# Patient Record
Sex: Male | Born: 1950 | Race: White | Hispanic: No | Marital: Married | Smoking: Former smoker
Health system: Southern US, Community
[De-identification: ages and names within clinical notes are randomized; demographics above are authoritative.]

## PROBLEM LIST (undated history)

## (undated) DIAGNOSIS — R6882 Decreased libido: Secondary | ICD-10-CM

## (undated) DIAGNOSIS — E559 Vitamin D deficiency, unspecified: Secondary | ICD-10-CM

## (undated) DIAGNOSIS — I10 Essential (primary) hypertension: Secondary | ICD-10-CM

## (undated) HISTORY — DX: Vitamin D deficiency, unspecified: E55.9

## (undated) HISTORY — DX: Essential (primary) hypertension: I10

## (undated) HISTORY — PX: CATARACT EXTRACTION, BILATERAL: SHX1313

---
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
## 2003-05-24 ENCOUNTER — Ambulatory Visit (HOSPITAL_COMMUNITY): Admission: RE | Admit: 2003-05-24 | Discharge: 2003-05-24 | Payer: Self-pay | Admitting: Internal Medicine

## 2013-05-09 ENCOUNTER — Encounter: Payer: Self-pay | Admitting: Family Medicine

## 2013-05-09 ENCOUNTER — Ambulatory Visit (INDEPENDENT_AMBULATORY_CARE_PROVIDER_SITE_OTHER): Payer: 59 | Admitting: Family Medicine

## 2013-05-09 VITALS — BP 122/82 | Ht 69.0 in | Wt 187.8 lb

## 2013-05-09 DIAGNOSIS — E782 Mixed hyperlipidemia: Secondary | ICD-10-CM

## 2013-05-09 DIAGNOSIS — G629 Polyneuropathy, unspecified: Secondary | ICD-10-CM | POA: Insufficient documentation

## 2013-05-09 DIAGNOSIS — Z125 Encounter for screening for malignant neoplasm of prostate: Secondary | ICD-10-CM

## 2013-05-09 DIAGNOSIS — E785 Hyperlipidemia, unspecified: Secondary | ICD-10-CM

## 2013-05-09 DIAGNOSIS — Z79899 Other long term (current) drug therapy: Secondary | ICD-10-CM

## 2013-05-09 DIAGNOSIS — Z0000 Encounter for general adult medical examination without abnormal findings: Secondary | ICD-10-CM

## 2013-05-09 DIAGNOSIS — M47812 Spondylosis without myelopathy or radiculopathy, cervical region: Secondary | ICD-10-CM

## 2013-05-09 DIAGNOSIS — G609 Hereditary and idiopathic neuropathy, unspecified: Secondary | ICD-10-CM

## 2013-05-09 LAB — PSA: PSA: 0.86 ng/mL (ref <=4.00)

## 2013-05-09 LAB — HEPATIC FUNCTION PANEL
ALT: 23 U/L (ref 0–53)
Albumin: 4.4 g/dL (ref 3.5–5.2)
Bilirubin, Direct: 0.1 mg/dL (ref 0.0–0.3)
Indirect Bilirubin: 0.4 mg/dL (ref 0.0–0.9)
Total Protein: 7.3 g/dL (ref 6.0–8.3)

## 2013-05-09 LAB — BASIC METABOLIC PANEL
Chloride: 103 mEq/L (ref 96–112)
Glucose, Bld: 94 mg/dL (ref 70–99)
Potassium: 4.5 mEq/L (ref 3.5–5.3)
Sodium: 139 mEq/L (ref 135–145)

## 2013-05-09 LAB — LIPID PANEL
Cholesterol: 211 mg/dL — ABNORMAL HIGH (ref 0–200)
HDL: 41 mg/dL (ref >39)
LDL Cholesterol: 129 mg/dL — ABNORMAL HIGH (ref 0–99)
Total CHOL/HDL Ratio: 5.1 Ratio
VLDL: 41 mg/dL — ABNORMAL HIGH (ref 0–40)

## 2013-05-09 NOTE — Progress Notes (Signed)
  Subjective:    Patient ID: Benjamin Todd, male    DOB: 1950/12/20, 62 y.o.   MRN: [id number]  HPI  Patient arrives for annual physical.  At times cracking sensaation in the neck--uncomfortable. Positive pain at times.  Gets up and down at work a lot, every eve walks.  Eats whatever he wants, has a variety of foods, plenty veggies.  Notes feet improvement when taking B12 annd folic acid. Smokes cigars still   Review of Systems  Constitutional: Negative for fever, activity change and appetite change.  HENT: Negative for congestion, rhinorrhea and neck pain.   Eyes: Negative for discharge.  Respiratory: Negative for cough and wheezing.   Cardiovascular: Negative for chest pain.  Gastrointestinal: Negative for vomiting, abdominal pain and blood in stool.  Genitourinary: Negative for frequency and difficulty urinating.  Skin: Negative for rash.  Allergic/Immunologic: Negative for environmental allergies and food allergies.  Neurological: Negative for weakness and headaches.  Psychiatric/Behavioral: Negative for agitation.       Objective:   Physical Exam  Vitals reviewed. Constitutional: He appears well-developed and well-nourished.  HENT:  Head: Normocephalic and atraumatic.  Right Ear: External ear normal.  Left Ear: External ear normal.  Nose: Nose normal.  Mouth/Throat: Oropharynx is clear and moist.  Eyes: EOM are normal. Pupils are equal, round, and reactive to light.  Neck: Normal range of motion. Neck supple. No thyromegaly present.  Cardiovascular: Normal rate, regular rhythm and normal heart sounds.   No murmur heard. Pulmonary/Chest: Effort normal and breath sounds normal. No respiratory distress. He has no wheezes.  Abdominal: Soft. Bowel sounds are normal. He exhibits no distension and no mass. There is no tenderness.  Genitourinary: Penis normal.  Musculoskeletal: Normal range of motion. He exhibits no edema.  Lymphadenopathy:    He has no cervical  adenopathy.  Neurological: He is alert. He exhibits normal muscle tone.  Skin sensation slightly diminished and distal plantar surface bilateral feet  Skin: Skin is warm and dry. No erythema.  Psychiatric: He has a normal mood and affect. His behavior is normal. Judgment normal.   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 #4 distal diminished sensation stable per patient actually somewhat better with over-the-counter vitamin B12 and folic acid supplement. Plan patient to call and set up a scope with his gastroenterologist. Declines vaccines. Appropriate blood work. Diet exercise discussed in encourage. Encouraged to quit smoking. WSL

## 2013-05-09 NOTE — Patient Instructions (Signed)
Take 1000 micrograms b12 daily and 1 mg of folic acid daily  Be sure to get your colonoscopy

## 2015-08-24 ENCOUNTER — Ambulatory Visit (INDEPENDENT_AMBULATORY_CARE_PROVIDER_SITE_OTHER): Payer: Managed Care, Other (non HMO) | Admitting: Family Medicine

## 2015-08-24 ENCOUNTER — Encounter: Payer: Self-pay | Admitting: Family Medicine

## 2015-08-24 VITALS — BP 170/84 | HR 113 | Temp 98.1°F | Ht 69.0 in | Wt 191.0 lb

## 2015-08-24 DIAGNOSIS — J683 Other acute and subacute respiratory conditions due to chemicals, gases, fumes and vapors: Secondary | ICD-10-CM

## 2015-08-24 DIAGNOSIS — J4520 Mild intermittent asthma, uncomplicated: Secondary | ICD-10-CM

## 2015-08-24 DIAGNOSIS — J329 Chronic sinusitis, unspecified: Secondary | ICD-10-CM | POA: Diagnosis not present

## 2015-08-24 MED ORDER — LEVOFLOXACIN 500 MG PO TABS: 500.0000 mg | ORAL_TABLET | Freq: Every day | ORAL | Status: AC

## 2015-08-24 MED ORDER — ALBUTEROL SULFATE HFA 108 (90 BASE) MCG/ACT IN AERS: 2.0000 | INHALATION_SPRAY | Freq: Four times a day (QID) | RESPIRATORY_TRACT | Status: DC | PRN

## 2015-08-24 NOTE — Progress Notes (Signed)
   Subjective:    Patient ID: Benjamin Todd, male    DOB: [birth year], 65 y.o.   MRN: [id number]  Cough This is a new problem. The current episode started in the past 7 days. The problem has been gradually worsening. The problem occurs every few minutes. The cough is productive of sputum (Dark Green Sputum). Associated symptoms include shortness of breath and wheezing. Nothing aggravates the symptoms. He has tried OTC cough suppressant (Robitussin) for the symptoms.   Started a s scratchy throat  Coughing up gunk  troubl catching breath  Sig wheezy, Has not had to use an inhaler in the past No major fever  Patient states no other concerns this visit.  Review of Systems  Respiratory: Positive for cough, shortness of breath and wheezing.    Ongoing smoking habit   no vomiting or diarrhea Objective:   Physical Exam  Alert mild malaise. Blood pressure repeat significantly improved H&T moderate his congestion frontal tenderness pharynx erythematous lungs bilateral wheezing no tachypnea positive bronchial cough lungs clear      Assessment & Plan:  Impression rhinosinusitis/bronchitis with exacerbation of reactive airways plan albuterol use discussed. Daily Levaquin. Encouraged to stop smoking WSL wellness exam encouraged

## 2016-03-11 ENCOUNTER — Telehealth: Payer: Self-pay | Admitting: Family Medicine

## 2016-03-11 DIAGNOSIS — E785 Hyperlipidemia, unspecified: Secondary | ICD-10-CM

## 2016-03-11 DIAGNOSIS — Z139 Encounter for screening, unspecified: Secondary | ICD-10-CM

## 2016-03-11 NOTE — Telephone Encounter (Signed)
Lip liv m7 psa 

## 2016-03-11 NOTE — Telephone Encounter (Signed)
Spoke with patient and informed him per Dr.Steve Whitman ordered, needs to be fasting prior to having labs drawn. Patient verbalized understanding.

## 2016-03-11 NOTE — Telephone Encounter (Signed)
Patient is requesting lab paper before physical appointment on Aug.23.

## 2016-03-18 LAB — HEPATIC FUNCTION PANEL
ALK PHOS: 61 IU/L (ref 39–117)
ALT: 27 IU/L (ref 0–44)
AST: 19 IU/L (ref 0–40)
Albumin: 4.5 g/dL (ref 3.6–4.8)
BILIRUBIN TOTAL: 0.4 mg/dL (ref 0.0–1.2)
Bilirubin, Direct: 0.12 mg/dL (ref 0.00–0.40)
Total Protein: 7 g/dL (ref 6.0–8.5)

## 2016-03-18 LAB — PSA: Prostate Specific Ag, Serum: 1.1 ng/mL (ref 0.0–4.0)

## 2016-03-18 LAB — BASIC METABOLIC PANEL
BUN / CREAT RATIO: 12 (ref 10–24)
BUN: 14 mg/dL (ref 8–27)
CO2: 23 mmol/L (ref 18–29)
Calcium: 9.4 mg/dL (ref 8.6–10.2)
Chloride: 100 mmol/L (ref 96–106)
Creatinine, Ser: 1.16 mg/dL (ref 0.76–1.27)
GFR calc Af Amer: 75 mL/min/{1.73_m2} (ref >59)
GFR calc non Af Amer: 65 mL/min/{1.73_m2} (ref >59)
Glucose: 109 mg/dL — ABNORMAL HIGH (ref 65–99)
POTASSIUM: 4.8 mmol/L (ref 3.5–5.2)
SODIUM: 139 mmol/L (ref 134–144)

## 2016-03-18 LAB — LIPID PANEL
CHOLESTEROL TOTAL: 220 mg/dL — AB (ref 100–199)
Chol/HDL Ratio: 5.4 ratio units — ABNORMAL HIGH (ref 0.0–5.0)
HDL: 41 mg/dL (ref >39)
LDL CALC: 132 mg/dL — AB (ref 0–99)
TRIGLYCERIDES: 233 mg/dL — AB (ref 0–149)
VLDL Cholesterol Cal: 47 mg/dL — ABNORMAL HIGH (ref 5–40)

## 2016-03-27 ENCOUNTER — Ambulatory Visit (HOSPITAL_COMMUNITY)
Admission: RE | Admit: 2016-03-27 | Discharge: 2016-03-27 | Disposition: A | Discharge status: Home / Self Care | Payer: BLUE CROSS/BLUE SHIELD | Source: Ambulatory Visit | Attending: Family Medicine | Admitting: Family Medicine

## 2016-03-27 ENCOUNTER — Encounter: Payer: Self-pay | Admitting: Family Medicine

## 2016-03-27 ENCOUNTER — Ambulatory Visit (INDEPENDENT_AMBULATORY_CARE_PROVIDER_SITE_OTHER): Payer: BLUE CROSS/BLUE SHIELD | Admitting: Family Medicine

## 2016-03-27 VITALS — BP 148/80 | Temp 98.1°F | Ht 69.0 in | Wt 192.2 lb

## 2016-03-27 DIAGNOSIS — R918 Other nonspecific abnormal finding of lung field: Secondary | ICD-10-CM | POA: Diagnosis not present

## 2016-03-27 DIAGNOSIS — R0601 Orthopnea: Secondary | ICD-10-CM | POA: Diagnosis not present

## 2016-03-27 DIAGNOSIS — J019 Acute sinusitis, unspecified: Secondary | ICD-10-CM | POA: Diagnosis not present

## 2016-03-27 DIAGNOSIS — J209 Acute bronchitis, unspecified: Secondary | ICD-10-CM | POA: Insufficient documentation

## 2016-03-27 DIAGNOSIS — B9689 Other specified bacterial agents as the cause of diseases classified elsewhere: Secondary | ICD-10-CM

## 2016-03-27 DIAGNOSIS — I700 Atherosclerosis of aorta: Secondary | ICD-10-CM | POA: Insufficient documentation

## 2016-03-27 DIAGNOSIS — R0600 Dyspnea, unspecified: Secondary | ICD-10-CM

## 2016-03-27 MED ORDER — LEVOFLOXACIN 500 MG PO TABS: 500.0000 mg | ORAL_TABLET | Freq: Every day | ORAL | 0 refills | Status: DC

## 2016-03-27 MED ORDER — ALBUTEROL SULFATE HFA 108 (90 BASE) MCG/ACT IN AERS: 2.0000 | INHALATION_SPRAY | Freq: Four times a day (QID) | RESPIRATORY_TRACT | 2 refills | Status: DC | PRN

## 2016-03-27 NOTE — Progress Notes (Signed)
   Subjective:    Patient ID: Benjamin Todd, male    DOB: [birth year], 65 y.o.   MRN: [id number]  Cough  This is a new problem. The current episode started 1 to 4 weeks ago. Associated symptoms include nasal congestion, rhinorrhea and wheezing. Pertinent negatives include no chest pain, ear pain or fever. Treatments tried: mucinex.   Patient relates coughing congestion occasional wheezing also relates bringing up yellow-green phlegm denies severe headaches denies sinus pressure pain. Does relate head congestion.   Review of Systems  Constitutional: Negative for activity change and fever.  HENT: Positive for congestion and rhinorrhea. Negative for ear pain.   Eyes: Negative for discharge.  Respiratory: Positive for cough and wheezing.   Cardiovascular: Negative for chest pain.       Objective:   Physical Exam  Constitutional: He appears well-developed.  HENT:  Head: Normocephalic.  Mouth/Throat: Oropharynx is clear and moist. No oropharyngeal exudate.  Neck: Normal range of motion.  Cardiovascular: Normal rate, regular rhythm and normal heart sounds.   No murmur heard. Pulmonary/Chest: Effort normal and breath sounds normal. He has no wheezes.  Lymphadenopathy:    He has no cervical adenopathy.  Neurological: He exhibits normal muscle tone.  Skin: Skin is warm and dry.  Nursing note and vitals reviewed.         Assessment & Plan:  Severe bronchitis Some mild nocturnal dyspnea Chest x-ray I doubt congestive heart failure patient denies any chest tightness swelling in the legs or shortness of breath with activity. Await the chest x-ray result Levaquin 10 days Patient encouraged not to smoke Patient may benefit from COPD workup if ongoing symptoms. Patient does have a follow-up visit in the near future warning signs were discussed follow-up sooner problems

## 2016-04-09 ENCOUNTER — Encounter: Payer: Managed Care, Other (non HMO) | Admitting: Family Medicine

## 2016-04-16 ENCOUNTER — Ambulatory Visit (INDEPENDENT_AMBULATORY_CARE_PROVIDER_SITE_OTHER): Payer: BLUE CROSS/BLUE SHIELD | Admitting: Family Medicine

## 2016-04-16 ENCOUNTER — Encounter: Payer: Self-pay | Admitting: Family Medicine

## 2016-04-16 ENCOUNTER — Telehealth: Payer: Self-pay | Admitting: Family Medicine

## 2016-04-16 VITALS — BP 154/90 | Temp 98.4°F | Ht 68.25 in | Wt 190.0 lb

## 2016-04-16 DIAGNOSIS — J441 Chronic obstructive pulmonary disease with (acute) exacerbation: Secondary | ICD-10-CM | POA: Diagnosis not present

## 2016-04-16 DIAGNOSIS — E785 Hyperlipidemia, unspecified: Secondary | ICD-10-CM | POA: Diagnosis not present

## 2016-04-16 DIAGNOSIS — I10 Essential (primary) hypertension: Secondary | ICD-10-CM | POA: Diagnosis not present

## 2016-04-16 DIAGNOSIS — Z0000 Encounter for general adult medical examination without abnormal findings: Secondary | ICD-10-CM | POA: Diagnosis not present

## 2016-04-16 MED ORDER — SILDENAFIL CITRATE 20 MG PO TABS: ORAL_TABLET | ORAL | 0 refills | Status: DC

## 2016-04-16 NOTE — Telephone Encounter (Signed)
Pt called stating that he has decided to start taking a cholesterol medicine. Pt would like that called in.      [facility]

## 2016-04-16 NOTE — Patient Instructions (Signed)
B12 1000 micrograms and folic acid one mg daily  Hypertension Hypertension, commonly called high blood pressure, is when the force of blood pumping through your arteries is too strong. Your arteries are the blood vessels that carry blood from your heart throughout your body. A blood pressure reading consists of a higher number over a lower number, such as 110/72. The higher number (systolic) is the pressure inside your arteries when your heart pumps. The lower number (diastolic) is the pressure inside your arteries when your heart relaxes. Ideally you want your blood pressure below 120/80. Hypertension forces your heart to work harder to pump blood. Your arteries may become narrow or stiff. Having untreated or uncontrolled hypertension can cause heart attack, stroke, kidney disease, and other problems. RISK FACTORS Some risk factors for high blood pressure are controllable. Others are not.  Risk factors you cannot control include:   Race. You may be at higher risk if you are African American.  Age. Risk increases with age.  Gender. Men are at higher risk than women before age 8 years. After age 39, women are at higher risk than men. Risk factors you can control include:  Not getting enough exercise or physical activity.  Being overweight.  Getting too much fat, sugar, calories, or salt in your diet.  Drinking too much alcohol. SIGNS AND SYMPTOMS Hypertension does not usually cause signs or symptoms. Extremely high blood pressure (hypertensive crisis) may cause headache, anxiety, shortness of breath, and nosebleed. DIAGNOSIS To check if you have hypertension, your health care provider will measure your blood pressure while you are seated, with your arm held at the level of your heart. It should be measured at least twice using the same arm. Certain conditions can cause a difference in blood pressure between your right and left arms. A blood pressure reading that is higher than normal on one  occasion does not mean that you need treatment. If it is not clear whether you have high blood pressure, you may be asked to return on a different day to have your blood pressure checked again. Or, you may be asked to monitor your blood pressure at home for 1 or more weeks. TREATMENT Treating high blood pressure includes making lifestyle changes and possibly taking medicine. Living a healthy lifestyle can help lower high blood pressure. You may need to change some of your habits. Lifestyle changes may include:  Following the DASH diet. This diet is high in fruits, vegetables, and whole grains. It is low in salt, red meat, and added sugars.  Keep your sodium intake below 2,300 mg per day.  Getting at least 30-45 minutes of aerobic exercise at least 4 times per week.  Losing weight if necessary.  Not smoking.  Limiting alcoholic beverages.  Learning ways to reduce stress. Your health care provider may prescribe medicine if lifestyle changes are not enough to get your blood pressure under control, and if one of the following is true:  You are 43-109 years of age and your systolic blood pressure is above 140.  You are 49 years of age or older, and your systolic blood pressure is above 150.  Your diastolic blood pressure is above 90.  You have diabetes, and your systolic blood pressure is over [id number] or your diastolic blood pressure is over 90.  You have kidney disease and your blood pressure is above 140/90.  You have heart disease and your blood pressure is above 140/90. Your personal target blood pressure may vary depending on your  medical conditions, your age, and other factors. HOME CARE INSTRUCTIONS  Have your blood pressure rechecked as directed by your health care provider.   Take medicines only as directed by your health care provider. Follow the directions carefully. Blood pressure medicines must be taken as prescribed. The medicine does not work as well when you skip doses.  Skipping doses also puts you at risk for problems.  Do not smoke.   Monitor your blood pressure at home as directed by your health care provider. SEEK MEDICAL CARE IF:   You think you are having a reaction to medicines taken.  You have recurrent headaches or feel dizzy.  You have swelling in your ankles.  You have trouble with your vision. SEEK IMMEDIATE MEDICAL CARE IF:  You develop a severe headache or confusion.  You have unusual weakness, numbness, or feel faint.  You have severe chest or abdominal pain.  You vomit repeatedly.  You have trouble breathing. MAKE SURE YOU:   Understand these instructions.  Will watch your condition.  Will get help right away if you are not doing well or get worse.   This information is not intended to replace advice given to you by your health care provider. Make sure you discuss any questions you have with your health care provider.   Document Released: 08/04/2005 Document Revised: 12/19/2014 Document Reviewed: 05/27/2013 Elsevier Interactive Patient Education 2016 Else[employer] Fat and Cholesterol Restricted Diet High levels of fat and cholesterol in your blood may lead to various health problems, such as diseases of the heart, blood vessels, gallbladder, liver, and pancreas. Fats are concentrated sources of energy that come in various forms. Certain types of fat, including saturated fat, may be harmful in excess. Cholesterol is a substance needed by your body in small amounts. Your body makes all the cholesterol it needs. Excess cholesterol comes from the food you eat. When you have high levels of cholesterol and saturated fat in your blood, health problems can develop because the excess fat and cholesterol will gather along the walls of your blood vessels, causing them to narrow. Choosing the right foods will help you control your intake of fat and cholesterol. This will help keep the levels of these substances in your blood within  normal limits and reduce your risk of disease. WHAT IS MY PLAN? Your health care provider recommends that you:  Get no more than __________ % of the total calories in your daily diet from fat.  Limit your intake of saturated fat to less than ______% of your total calories each day.  Limit the amount of cholesterol in your diet to less than _________mg per day. WHAT TYPES OF FAT SHOULD I CHOOSE?  Choose healthy fats more often. Choose monounsaturated and polyunsaturated fats, such as olive and canola oil, flaxseeds, walnuts, almonds, and seeds.  Eat more omega-3 fats. Good choices include salmon, mackerel, sardines, tuna, flaxseed oil, and ground flaxseeds. Aim to eat fish at least two times a week.  Limit saturated fats. Saturated fats are primarily found in animal products, such as meats, butter, and cream. Plant sources of saturated fats include palm oil, palm kernel oil, and coconut oil.  Avoid foods with partially hydrogenated oils in them. These contain trans fats. Examples of foods that contain trans fats are stick margarine, some tub margarines, cookies, crackers, and other baked goods. WHAT GENERAL GUIDELINES DO I NEED TO FOLLOW? These guidelines for healthy eating will help you control your intake of fat and cholesterol:  Check  food labels carefully to identify foods with trans fats or high amounts of saturated fat.  Fill one half of your plate with vegetables and green salads.  Fill one fourth of your plate with whole grains. Look for the word "whole" as the first word in the ingredient list.  Fill one fourth of your plate with lean protein foods.  Limit fruit to two servings a day. Choose fruit instead of juice.  Eat more foods that contain soluble fiber. Examples of foods that contain this type of fiber are apples, broccoli, carrots, beans, peas, and barley. Aim to get 20-30 g of fiber per day.  Eat more home-cooked food and less restaurant, buffet, and fast  food.  Limit or avoid alcohol.  Limit foods high in starch and sugar.  Limit fried foods.  Cook foods using methods other than frying. Baking, boiling, grilling, and broiling are all great options.  Lose weight if you are overweight. Losing just 5-10% of your initial body weight can help your overall health and prevent diseases such as diabetes and heart disease. WHAT FOODS CAN I EAT? Grains Whole grains, such as whole wheat or whole grain breads, crackers, cereals, and pasta. Unsweetened oatmeal, bulgur, barley, quinoa, or brown rice. Corn or whole wheat flour tortillas. Vegetables Fresh or frozen vegetables (raw, steamed, roasted, or grilled). Green salads. Fruits All fresh, canned (in natural juice), or frozen fruits. Meat and Other Protein Products Ground beef (85% or leaner), grass-fed beef, or beef trimmed of fat. Skinless chicken or [place]. Ground chicken or [place]. Pork trimmed of fat. All fish and seafood. Eggs. Dried beans, peas, or lentils. Unsalted nuts or seeds. Unsalted canned or dry beans. Dairy Low-fat dairy products, such as skim or 1% milk, 2% or reduced-fat cheeses, low-fat ricotta or cottage cheese, or plain low-fat yogurt. Fats and Oils Tub margarines without trans fats. Light or reduced-fat mayonnaise and salad dressings. Avocado. Olive, canola, sesame, or safflower oils. Natural peanut or almond butter (choose ones without added sugar and oil). The items listed above may not be a complete list of recommended foods or beverages. Contact your dietitian for more options. WHAT FOODS ARE NOT RECOMMENDED? Grains White bread. White pasta. White rice. Cornbread. Bagels, pastries, and croissants. Crackers that contain trans fat. Vegetables White potatoes. Corn. Creamed or fried vegetables. Vegetables in a cheese sauce. Fruits Dried fruits. Canned fruit in light or heavy syrup. Fruit juice. Meat and Other Protein Products Fatty cuts of meat. Ribs, chicken wings, bacon,  sausage, bologna, salami, chitterlings, fatback, hot dogs, bratwurst, and packaged luncheon meats. Liver and organ meats. Dairy Whole or 2% milk, cream, half-and-half, and cream cheese. Whole milk cheeses. Whole-fat or sweetened yogurt. Full-fat cheeses. Nondairy creamers and whipped toppings. Processed cheese, cheese spreads, or cheese curds. Sweets and Desserts Corn syrup, sugars, honey, and molasses. Candy. Jam and jelly. Syrup. Sweetened cereals. Cookies, pies, cakes, donuts, muffins, and ice cream. Fats and Oils Butter, stick margarine, lard, shortening, ghee, or bacon fat. Coconut, palm kernel, or palm oils. Beverages Alcohol. Sweetened drinks (such as sodas, lemonade, and fruit drinks or punches). The items listed above may not be a complete list of foods and beverages to avoid. Contact your dietitian for more information.   This information is not intended to replace advice given to you by your health care provider. Make sure you discuss any questions you have with your health care provider.   Document Released: 08/04/2005 Document Revised: 08/25/2014 Document Reviewed: 11/02/2013 Elsevier Interactive Patient Education [employer].

## 2016-04-16 NOTE — Progress Notes (Signed)
Subjective:    Patient ID: Benjamin Todd, male    DOB: 1951-08-07, 65 y.o.   MRN: [id number]  HPI AWV- Annual Wellness Visit  The patient was seen for their annual wellness visit. The patient's past medical history, surgical history, and family history were reviewed. Pertinent vaccines were reviewed ( tetanus, pneumonia, shingles, flu) The patient's medication list was reviewed and updated.  The height and weight were entered. The patient's current BMI is: 28.68  Cognitive screening was completed. Outcome of Mini - Cog: pass  Falls within the past 6 months: none  Current tobacco usage: yes (All patients who use tobacco were given written and verbal information on quitting)  Recent listing of emergency department/hospitalizations over the past year were reviewed.  current specialist the patient sees on a regular basis: none   Medicare annual wellness visit patient questionnaire was reviewed.  A written screening schedule for the patient for the next 5-10 years was given. Appropriate discussion of followup regarding next visit was discussed.  Finished levaquin. Still having  Congestion. Had chest xray on 8/10.    Results for orders placed or performed in visit on 03/11/16  Lipid panel  Result Value Ref Range   Cholesterol, Total 220 (H) 100 - 199 mg/dL   Triglycerides 233 (H) 0 - 149 mg/dL   HDL 41 >39 mg/dL   VLDL Cholesterol Cal 47 (H) 5 - 40 mg/dL   LDL Calculated 132 (H) 0 - 99 mg/dL   Chol/HDL Ratio 5.4 (H) 0.0 - 5.0 ratio units  Hepatic function panel  Result Value Ref Range   Total Protein 7.0 6.0 - 8.5 g/dL   Albumin 4.5 3.6 - 4.8 g/dL   Bilirubin Total 0.4 0.0 - 1.2 mg/dL   Bilirubin, Direct 0.12 0.00 - 0.40 mg/dL   Alkaline Phosphatase 61 39 - 117 IU/L   AST 19 0 - 40 IU/L   ALT 27 0 - 44 IU/L  PSA  Result Value Ref Range   Prostate Specific Ag, Serum 1.1 0.0 - 4.0 ng/mL  Basic metabolic panel  Result Value Ref Range   Glucose 109 (H) 65 - 99 mg/dL   BUN 14 8 - 27 mg/dL   Creatinine, Ser 1.16 0.76 - 1.27 mg/dL   GFR calc non Af Amer 65 >59 mL/min/1.73   GFR calc Af Amer 75 >59 mL/min/1.73   BUN/Creatinine Ratio 12 10 - 24   Sodium 139 134 - 144 mmol/L   Potassium 4.8 3.5 - 5.2 mmol/L   Chloride 100 96 - 106 mmol/L   CO2 23 18 - 29 mmol/L   Calcium 9.4 8.6 - 10.2 mg/dL    Right foot swelling. Started 4 days ago. Soaking in epsom salt.    Review of Systems  Constitutional: Negative for activity change, appetite change and fever.  HENT: Negative for congestion and rhinorrhea.   Eyes: Negative for discharge.  Respiratory: Negative for cough and wheezing.   Cardiovascular: Negative for chest pain.  Gastrointestinal: Negative for abdominal pain, blood in stool and vomiting.  Genitourinary: Negative for difficulty urinating and frequency.  Musculoskeletal: Negative for neck pain.  Skin: Negative for rash.  Allergic/Immunologic: Negative for environmental allergies and food allergies.  Neurological: Negative for weakness and headaches.  Psychiatric/Behavioral: Negative for agitation.  All other systems reviewed and are negative.      Objective:   Physical Exam  Constitutional: He appears well-developed and well-nourished.  HENT:  Head: Normocephalic and atraumatic.  Right Ear: External ear normal.  Left Ear: External ear normal.  Nose: Nose normal.  Mouth/Throat: Oropharynx is clear and moist.  Eyes: EOM are normal. Pupils are equal, round, and reactive to light.  Neck: Normal range of motion. Neck supple. No thyromegaly present.  Cardiovascular: Normal rate, regular rhythm and normal heart sounds.   No murmur heard. Pulmonary/Chest: Effort normal and breath sounds normal. No respiratory distress. He has no wheezes.  Abdominal: Soft. Bowel sounds are normal. He exhibits no distension and no mass. There is no tenderness.  Genitourinary: Penis normal.  Musculoskeletal: Normal range of motion. He exhibits no edema.    Lymphadenopathy:    He has no cervical adenopathy.  Neurological: He is alert. He exhibits normal muscle tone.  Skin: Skin is warm and dry. No erythema.  Psychiatric: He has a normal mood and affect. His behavior is normal. Judgment normal.  Vitals reviewed.  Prostate within normal limits       Assessment & Plan:  Impression wellness exam. Patient declines colonoscopy recommendation for now. #2 hypertension I have advised medications. Numbers up last 3 visits patient declines #3 hyperlipidemia it does not improve soon will definitely need medicine discuss #4 chronic smoking with intermittent challenges with cough and congestion, chest x-ray early in August showed suggestion of COPD plan patient declines blood pressure medicines today. Educational information given. Diet exercise discussed. Encouraged to stop smoking. Recheck in 4 months. WSL

## 2016-04-16 NOTE — Telephone Encounter (Signed)
Generic lipitor 20 one qhs 30 six ref

## 2016-04-17 MED ORDER — ATORVASTATIN CALCIUM 20 MG PO TABS: 20.0000 mg | ORAL_TABLET | Freq: Every day | ORAL | 5 refills | Status: DC

## 2016-04-17 NOTE — Telephone Encounter (Signed)
Prescription sent electronically to pharmacy. Patient notified. 

## 2016-08-04 ENCOUNTER — Telehealth: Payer: Self-pay | Admitting: Family Medicine

## 2016-08-04 DIAGNOSIS — Z131 Encounter for screening for diabetes mellitus: Secondary | ICD-10-CM

## 2016-08-04 DIAGNOSIS — E785 Hyperlipidemia, unspecified: Secondary | ICD-10-CM

## 2016-08-04 DIAGNOSIS — I10 Essential (primary) hypertension: Secondary | ICD-10-CM

## 2016-08-04 NOTE — Telephone Encounter (Signed)
Blood work ordered in EPIC. Patient notified. 

## 2016-08-04 NOTE — Telephone Encounter (Signed)
Lipid profile and glucose.  Autumn send this message line to rosie so she can send a geernic statement to pt regarding management of med problems and wellness. We disc copd  And htn and hyperlipidemia that day, all disease disc in addition to wellness visit. Also have rosie send info

## 2016-08-04 NOTE — Telephone Encounter (Signed)
Also, wanted to note that patient questioned the charge from his visit on 04/16/16.  He says he was seen for his physical.  I explained to him that if anything is gone over that is outside of what Dr. Richardson Landry feels is considered a physical, he will be charged for an OV as well.  He felt this was unethical.  I explained to him that due to insurance guidelines, this is how things are coded now.  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

## 2016-08-04 NOTE — Telephone Encounter (Signed)
Requesting order for blood work for upcoming appointment on 08/25/16 with Dr. Richardson Landry.

## 2016-08-25 ENCOUNTER — Ambulatory Visit: Payer: BLUE CROSS/BLUE SHIELD | Admitting: Family Medicine

## 2017-05-06 DIAGNOSIS — E291 Testicular hypofunction: Secondary | ICD-10-CM | POA: Diagnosis not present

## 2017-06-11 DIAGNOSIS — E291 Testicular hypofunction: Secondary | ICD-10-CM | POA: Diagnosis not present

## 2017-06-11 DIAGNOSIS — Z125 Encounter for screening for malignant neoplasm of prostate: Secondary | ICD-10-CM | POA: Diagnosis not present

## 2017-06-11 DIAGNOSIS — R5383 Other fatigue: Secondary | ICD-10-CM | POA: Diagnosis not present

## 2017-09-14 DIAGNOSIS — I10 Essential (primary) hypertension: Secondary | ICD-10-CM | POA: Diagnosis not present

## 2017-09-14 DIAGNOSIS — R5383 Other fatigue: Secondary | ICD-10-CM | POA: Diagnosis not present

## 2017-09-14 DIAGNOSIS — E559 Vitamin D deficiency, unspecified: Secondary | ICD-10-CM | POA: Diagnosis not present

## 2017-09-14 DIAGNOSIS — E785 Hyperlipidemia, unspecified: Secondary | ICD-10-CM | POA: Diagnosis not present

## 2017-09-14 DIAGNOSIS — Z23 Encounter for immunization: Secondary | ICD-10-CM | POA: Diagnosis not present

## 2017-09-14 DIAGNOSIS — R6882 Decreased libido: Secondary | ICD-10-CM | POA: Diagnosis not present

## 2017-10-14 DIAGNOSIS — R5383 Other fatigue: Secondary | ICD-10-CM | POA: Diagnosis not present

## 2017-10-14 DIAGNOSIS — I10 Essential (primary) hypertension: Secondary | ICD-10-CM | POA: Diagnosis not present

## 2017-10-14 DIAGNOSIS — E785 Hyperlipidemia, unspecified: Secondary | ICD-10-CM | POA: Diagnosis not present

## 2017-11-18 DIAGNOSIS — R6882 Decreased libido: Secondary | ICD-10-CM | POA: Diagnosis not present

## 2017-11-18 DIAGNOSIS — R5383 Other fatigue: Secondary | ICD-10-CM | POA: Diagnosis not present

## 2017-11-18 DIAGNOSIS — D72829 Elevated white blood cell count, unspecified: Secondary | ICD-10-CM | POA: Diagnosis not present

## 2017-11-18 DIAGNOSIS — I10 Essential (primary) hypertension: Secondary | ICD-10-CM | POA: Diagnosis not present

## 2018-03-18 DIAGNOSIS — Z125 Encounter for screening for malignant neoplasm of prostate: Secondary | ICD-10-CM | POA: Diagnosis not present

## 2018-03-18 DIAGNOSIS — I10 Essential (primary) hypertension: Secondary | ICD-10-CM | POA: Diagnosis not present

## 2018-03-18 DIAGNOSIS — D72829 Elevated white blood cell count, unspecified: Secondary | ICD-10-CM | POA: Diagnosis not present

## 2018-03-18 DIAGNOSIS — R6882 Decreased libido: Secondary | ICD-10-CM | POA: Diagnosis not present

## 2018-03-18 DIAGNOSIS — R5383 Other fatigue: Secondary | ICD-10-CM | POA: Diagnosis not present

## 2018-09-14 DIAGNOSIS — I10 Essential (primary) hypertension: Secondary | ICD-10-CM | POA: Diagnosis not present

## 2018-09-14 DIAGNOSIS — E559 Vitamin D deficiency, unspecified: Secondary | ICD-10-CM | POA: Diagnosis not present

## 2018-09-14 DIAGNOSIS — R5383 Other fatigue: Secondary | ICD-10-CM | POA: Diagnosis not present

## 2018-09-14 DIAGNOSIS — R6882 Decreased libido: Secondary | ICD-10-CM | POA: Diagnosis not present

## 2018-09-14 DIAGNOSIS — E785 Hyperlipidemia, unspecified: Secondary | ICD-10-CM | POA: Diagnosis not present

## 2018-09-22 DIAGNOSIS — D72829 Elevated white blood cell count, unspecified: Secondary | ICD-10-CM | POA: Diagnosis not present

## 2018-11-29 DIAGNOSIS — I10 Essential (primary) hypertension: Secondary | ICD-10-CM | POA: Diagnosis not present

## 2018-11-29 DIAGNOSIS — E785 Hyperlipidemia, unspecified: Secondary | ICD-10-CM | POA: Diagnosis not present

## 2018-11-29 DIAGNOSIS — J449 Chronic obstructive pulmonary disease, unspecified: Secondary | ICD-10-CM | POA: Diagnosis not present

## 2018-11-29 DIAGNOSIS — E559 Vitamin D deficiency, unspecified: Secondary | ICD-10-CM | POA: Diagnosis not present

## 2018-12-28 DIAGNOSIS — I10 Essential (primary) hypertension: Secondary | ICD-10-CM | POA: Diagnosis not present

## 2018-12-28 DIAGNOSIS — Z87891 Personal history of nicotine dependence: Secondary | ICD-10-CM | POA: Diagnosis not present

## 2018-12-28 DIAGNOSIS — Z139 Encounter for screening, unspecified: Secondary | ICD-10-CM | POA: Diagnosis not present

## 2018-12-28 DIAGNOSIS — E785 Hyperlipidemia, unspecified: Secondary | ICD-10-CM | POA: Diagnosis not present

## 2019-03-28 DIAGNOSIS — I10 Essential (primary) hypertension: Secondary | ICD-10-CM | POA: Diagnosis not present

## 2019-03-28 DIAGNOSIS — F1021 Alcohol dependence, in remission: Secondary | ICD-10-CM | POA: Diagnosis not present

## 2019-03-28 DIAGNOSIS — R6882 Decreased libido: Secondary | ICD-10-CM | POA: Diagnosis not present

## 2019-03-28 DIAGNOSIS — E559 Vitamin D deficiency, unspecified: Secondary | ICD-10-CM | POA: Diagnosis not present

## 2019-05-18 ENCOUNTER — Ambulatory Visit (INDEPENDENT_AMBULATORY_CARE_PROVIDER_SITE_OTHER): Payer: PPO | Admitting: Internal Medicine

## 2019-05-18 ENCOUNTER — Encounter (INDEPENDENT_AMBULATORY_CARE_PROVIDER_SITE_OTHER): Payer: Self-pay | Admitting: Internal Medicine

## 2019-05-18 ENCOUNTER — Other Ambulatory Visit: Payer: Self-pay

## 2019-05-18 VITALS — BP 150/90 | HR 72 | Ht 69.0 in | Wt 178.6 lb

## 2019-05-18 DIAGNOSIS — R6882 Decreased libido: Secondary | ICD-10-CM

## 2019-05-18 DIAGNOSIS — I10 Essential (primary) hypertension: Secondary | ICD-10-CM | POA: Diagnosis not present

## 2019-05-18 HISTORY — DX: Decreased libido: R68.82

## 2019-05-18 MED ORDER — SILDENAFIL CITRATE 50 MG PO TABS: 50.0000 mg | ORAL_TABLET | Freq: Every day | ORAL | 3 refills | Status: DC | PRN

## 2019-05-18 NOTE — Progress Notes (Signed)
Wellness Office Visit  Subjective:  Patient ID: Benjamin Todd, male    DOB: 1950/09/12  Age: 68 y.o. MRN: [id number]  CC: This man comes in for follow-up regarding his hypertension. HPI  I was concerned about his hypertension on the last visit and at that time he was drinking alcohol again.  He tells me that since the last visit, he has not drunk any alcohol at all.  He continues to monitor his blood pressure at home.  He denies any chest pain, dyspnea, palpitations or new limb weakness. Past Medical History:  Diagnosis Date  . Decreased libido 05/18/2019  . Essential hypertension, benign   . Vitamin D deficiency disease       Family History  Problem Relation Age of Onset  . Cancer Mother        breast  . 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     Current Meds  Medication Sig  . Cholecalciferol (VITAMIN D3) 125 MCG (5000 UT) TABS Take 2 tablets by mouth daily.  . sildenafil (VIAGRA) 50 MG tablet Take 1 tablet (50 mg total) by mouth daily as needed for erectile dysfunction.  Marland Kitchen testosterone cypionate (DEPOTESTOSTERONE CYPIONATE) 200 MG/ML injection Inject 100 mg into the muscle once a week.  . [DISCONTINUED] sildenafil (VIAGRA) 50 MG tablet Take 50 mg by mouth daily as needed for erectile dysfunction.     Bio Identical Hormones  Testosterone therapy is being used off label for symptoms of testosterone deficiency and benefits that it produces based on several studies.  These benefits include decreasing body fat, increasing in lean muscle mass and increasing in bone density.  There is improvement of memory, cognition.  There is improvement in exercise tolerance and endurance.  Testosterone therapy has also been shown to be protective against coronary artery disease, cerebrovascular disease, diabetes, hypertension and degenerative joint disease. I have discussed with the  patient the FDA warnings regarding testosterone therapy, benefits and side effects and modes of administration as well as monitoring blood levels and side effects  on a regular basis The patient is agreeable that testosterone therapy should be an integral part of his/her wellness,quality of life and prevention of chronic disease.  Objective:   Today's Vitals: BP (!) 150/90   Pulse 72   Ht 5\' 9"  (1.753 m)   Wt 178 lb 9.6 oz (81 kg)   BMI 26.37 kg/m  Vitals with BMI 05/18/2019 04/16/2016 03/27/2016  Height 5\' 9"  5' 8.25" 5\' 9"   Weight 178 lbs 10 oz 190 lbs 192 lbs 3 oz  BMI 26.36 0000000 [id number]  Systolic Q000111Q 123456 [id number]  Diastolic 90 90 80  Pulse 72 - -     Physical Exam    He looks systemically well but his blood pressure is still not controlled.  It is better than last time however.  He is alert and orientated without any focal neurological signs.   Assessment   1. Essential hypertension       Tests ordered No orders of the defined types were placed in this encounter.    Plan: 1. I had a long discussion with him regarding the criteria for hypertension diagnosis and also the risk factors involved with uncontrolled hypertension.  I have recommended to him the hypertensive therapy today.  He refuses to do this and would rather manage this by a combination of diet and exercise. 2. I  will see him in February of next year for his annual physical exam but in the meantime he will keep a close check on his blood pressure at home. 3. Today I spent 25 to 30 minutes with this patient face-to-face, more than 50% of the time was involved in discussing his hypertension and the risks that he is under with uncontrolled hypertension.     Doree Albee, MD

## 2019-05-18 NOTE — Patient Instructions (Signed)
[facility] Dietary Recommendations for Weight Loss What to Avoid . Avoid added sugars o Often added sugar can be found in processed foods such as many condiments, dry cereals, cakes, cookies, chips, crisps, crackers, candies, sweetened drinks, etc.  o Read labels and AVOID/DECREASE use of foods with the following in their ingredient list: Sugar, fructose, high fructose corn syrup, sucrose, glucose, maltose, dextrose, molasses, cane sugar, brown sugar, any type of syrup, agave nectar, etc.   . Avoid snacking in between meals . Avoid foods made with flour o If you are going to eat food made with flour, choose those made with whole-grains; and, minimize your consumption as much as is tolerable . Avoid processed foods o These foods are generally stocked in the middle of the grocery store. Focus on shopping on the perimeter of the grocery.  What to Include . Vegetables o GREEN LEAFY VEGETABLES: Kale, spinach, mustard greens, collard greens, cabbage, broccoli, etc. o OTHER: Asparagus, cauliflower, eggplant, carrots, peas, Brussel sprouts, tomatoes, bell peppers, zucchini, beets, cucumbers, etc. . Grains, seeds, and legumes o Beans: kidney beans, black eyed peas, garbanzo beans, black beans, pinto beans, etc. o Whole, unrefined grains: brown rice, barley, bulgur, oatmeal, etc. . Healthy fats  o Avoid highly processed fats such as vegetable oil o Examples of healthy fats: avocado, olives, virgin olive oil, dark chocolate (?72% Cocoa), nuts (peanuts, almonds, walnuts, cashews, pecans, etc.) . Low - Moderate Intake of Animal Sources of Protein o Meat sources: chicken, turkey, salmon, tuna. Limit to 4 ounces of meat at one time. o Consider limiting dairy sources, but when choosing dairy focus on: PLAIN Greek yogurt, cottage cheese, high-protein milk . Fruit o Choose berries  When to Eat . Intermittent Fasting: o Choosing not to eat for a specific time period, but DO FOCUS ON HYDRATION  when fasting o Multiple Techniques: - Time Restricted Eating: eat 3 meals in a day, each meal lasting no more than 60 minutes, no snacks between meals - 16-18 hour fast: fast for 16 to 18 hours up to 7 days a week. Often suggested to start with 2-3 nonconsecutive days per week.  . Remember the time you sleep is counted as fasting.  . Examples of eating schedule: Fast from 7:00pm-11:00am. Eat between 11:00am-7:00pm.  - 24-hour fast: fast for 24 hours up to every other day. Often suggested to start with 1 day per week . Remember the time you sleep is counted as fasting . Examples of eating schedule:  o Eating day: eat 2-3 meals on your eating day. If doing 2 meals, each meal should last no more than 90 minutes. If doing 3 meals, each meal should last no more than 60 minutes. Finish last meal by 7:00pm. o Fasting day: Fast until 7:00pm.  o IF YOU FEEL UNWELL FOR ANY REASON/IN ANY WAY WHEN FASTING, STOP FASTING BY EATING A NUTRITIOUS SNACK OR LIGHT MEAL o ALWAYS FOCUS ON HYDRATION DURING FASTS - Acceptable Hydration sources: water, broths, tea/coffee (black tea/coffee is best but using a small amount of whole-fat dairy products in coffee/tea is acceptable).  - Poor Hydration Sources: anything with sugar or artificial sweeteners added to it  These recommendations have been developed for patients that are actively receiving medical care from either Dr. Tattiana Fakhouri or Sarah Gray, DNP, NP-C at [facility]. These recommendations are developed for patients with specific medical conditions and are not meant to be distributed or used by others that are not actively receiving care from either provider listed   above at [facility]. It is not appropriate to participate in the above eating plans without proper medical supervision.   Reference: Fung, J. The obesity code. [place]: [place]; 2016.   

## 2019-05-19 ENCOUNTER — Ambulatory Visit (INDEPENDENT_AMBULATORY_CARE_PROVIDER_SITE_OTHER): Payer: BLUE CROSS/BLUE SHIELD | Admitting: Internal Medicine

## 2019-05-19 ENCOUNTER — Other Ambulatory Visit (INDEPENDENT_AMBULATORY_CARE_PROVIDER_SITE_OTHER): Payer: Self-pay | Admitting: Internal Medicine

## 2019-05-19 ENCOUNTER — Encounter (INDEPENDENT_AMBULATORY_CARE_PROVIDER_SITE_OTHER): Payer: Self-pay | Admitting: Internal Medicine

## 2019-05-19 MED ORDER — SILDENAFIL CITRATE 100 MG PO TABS: 100.0000 mg | ORAL_TABLET | Freq: Every day | ORAL | 1 refills | Status: DC | PRN

## 2019-07-10 ENCOUNTER — Other Ambulatory Visit (INDEPENDENT_AMBULATORY_CARE_PROVIDER_SITE_OTHER): Payer: Self-pay | Admitting: Internal Medicine

## 2019-09-27 ENCOUNTER — Other Ambulatory Visit: Payer: Self-pay

## 2019-09-27 ENCOUNTER — Ambulatory Visit (INDEPENDENT_AMBULATORY_CARE_PROVIDER_SITE_OTHER): Payer: PPO | Admitting: Internal Medicine

## 2019-09-27 ENCOUNTER — Encounter (INDEPENDENT_AMBULATORY_CARE_PROVIDER_SITE_OTHER): Payer: Self-pay | Admitting: Internal Medicine

## 2019-09-27 VITALS — BP 121/70 | HR 82 | Temp 97.8°F | Resp 18 | Ht 69.0 in | Wt 178.6 lb

## 2019-09-27 DIAGNOSIS — E559 Vitamin D deficiency, unspecified: Secondary | ICD-10-CM | POA: Diagnosis not present

## 2019-09-27 DIAGNOSIS — R6882 Decreased libido: Secondary | ICD-10-CM | POA: Diagnosis not present

## 2019-09-27 DIAGNOSIS — Z0001 Encounter for general adult medical examination with abnormal findings: Secondary | ICD-10-CM | POA: Diagnosis not present

## 2019-09-27 DIAGNOSIS — Z1159 Encounter for screening for other viral diseases: Secondary | ICD-10-CM | POA: Diagnosis not present

## 2019-09-27 DIAGNOSIS — I10 Essential (primary) hypertension: Secondary | ICD-10-CM

## 2019-09-27 DIAGNOSIS — E782 Mixed hyperlipidemia: Secondary | ICD-10-CM

## 2019-09-27 DIAGNOSIS — Z125 Encounter for screening for malignant neoplasm of prostate: Secondary | ICD-10-CM

## 2019-09-27 MED ORDER — TADALAFIL 20 MG PO TABS: 20.0000 mg | ORAL_TABLET | ORAL | 0 refills | Status: DC | PRN

## 2019-09-27 NOTE — Progress Notes (Signed)
Chief Complaint: This 69 year old man comes in for an annual physical exam. HPI: He is doing very well and he feels that he is much more energized since starting testosterone therapy and also he is now walking and exercising on a regular basis. He has some difficulty with refilling testosterone earlier because he runs out a little bit quicker.  He is not overdosing. He continues to take vitamin D3 supplementation for vitamin D deficiency. He wonders whether he could try Cialis instead of Viagra for erectile dysfunction which he suffers from.  Past Medical History:  Diagnosis Date  . Decreased libido 05/18/2019  . Essential hypertension, benign   . Vitamin D deficiency disease    History reviewed. No pertinent surgical history.   Social History   Social History Narrative   Married for since 1979.Lives with wife and son.Occ substitute Pharmacist, hospital.    Social History   Tobacco Use  . Smoking status: Former Smoker    Types: Cigars    Quit date: 08/11/2017    Years since quitting: 2.1  . Smokeless tobacco: Never Used  Substance Use Topics  . Alcohol use: Not Currently      Allergies: No Known Allergies   Current Meds  Medication Sig  . Cholecalciferol (VITAMIN D3) 125 MCG (5000 UT) TABS Take 2 tablets by mouth daily.  . sildenafil (VIAGRA) 100 MG tablet Take 1 tablet (100 mg total) by mouth daily as needed for erectile dysfunction.  Marland Kitchen testosterone cypionate (DEPOTESTOSTERONE CYPIONATE) 200 MG/ML injection INJECT 0.5 ML INTRAMUSCULARLY ONCE A WEEK       Bio-identical hormones Testosterone therapy is being used off label for symptoms of testosterone deficiency and benefits that it produces based on several studies.  These benefits include decreasing body fat, increasing in lean muscle mass and increasing in bone density.  There is improvement of memory, cognition.  There is improvement in exercise tolerance and endurance.  Testosterone therapy has also been shown to be  protective against coronary artery disease, cerebrovascular disease, diabetes, hypertension and degenerative joint disease. I have discussed with the patient the FDA warnings regarding testosterone therapy, benefits and side effects and modes of administration as well as monitoring blood levels and side effects  on a regular basis The patient is agreeable that testosterone therapy should be an integral part of his/her wellness,quality of life and prevention of chronic disease.  [id number] from the symptoms mentioned above,there are no other symptoms referable to all systems reviewed.  Physical Exam: Blood pressure 121/70, pulse 82, temperature 97.8 F (36.6 C), temperature source Temporal, resp. rate 18, height 5\' 9"  (1.753 m), weight 178 lb 9.6 oz (81 kg), SpO2 99 %. Vitals with BMI 09/27/2019 05/18/2019 04/16/2016  Height 5\' 9"  5\' 9"  5' 8.25"  Weight 178 lbs 10 oz 178 lbs 10 oz 190 lbs  BMI 26.36 [id number]  Systolic [id number] Q000111Q 123456  Diastolic 70 90 90  Pulse 82 72 -      He looks systemically well.  Blood pressure is well controlled. General: Alert, cooperative, and appears to be the stated age.No pallor.  No jaundice.  No clubbing. Head: Normocephalic Eyes: Sclera white, pupils equal and reactive to light, red reflex x 2,  Ears: Normal bilaterally Oral cavity: Lips, mucosa, and tongue normal: Teeth and gums normal Neck: No adenopathy, supple, symmetrical, trachea midline, and thyroid does not appear enlarged Respiratory: Clear to auscultation bilaterally.No wheezing, crackles or bronchial breathing. Cardiovascular: Heart sounds are present and appear to be normal without murmurs or  added sounds.  No carotid bruits.  Peripheral pulses are present and equal bilaterally.: Gastrointestinal:positive bowel sounds, no hepatosplenomegaly.  No masses felt.No tenderness. Skin: Clear, No rashes noted.No worrisome skin lesions seen. Neurological: Grossly intact without focal findings, cranial nerves II  through XII intact, muscle strength equal bilaterally Musculoskeletal: No acute joint abnormalities noted.Full range of movement noted with joints. Psychiatric: Affect appropriate, non-anxious.    Assessment  1. Essential hypertension   2. Decreased libido   3. Mixed hyperlipidemia   4. Vitamin D deficiency disease   5. Encounter for hepatitis C screening test for low risk patient   6. Special screening for malignant neoplasm of prostate   7. Encounter for general adult medical examination with abnormal findings     Tests Ordered:   Orders Placed This Encounter  Procedures  . CBC  . COMPLETE METABOLIC PANEL WITH GFR  . Lipid panel  . Testosterone Total,Free,Bio, Males  . VITAMIN D 25 Hydroxy (Vit-D Deficiency, Fractures)  . T3, free  . TSH  . Hepatitis C antibody  . PSA     Plan  1. Blood work is ordered above. 2. He will continue with testosterone therapy and he does not require refills at the present time. 3. I have sent a new prescription for Cialis to see if he can tolerate this better than Viagra. 4. He was given Tdap vaccination today. 5. Further recommendations will depend on blood results and I will see him in 6 months time for follow-up. 6. In addition to a preventative visit today, I also performed an office visit to discuss his testosterone therapy, possible side effects that he may be getting with skin lesions although he is not very concerned about this.  He did certainly wants to continue taking testosterone therapy.  We also discussed Covid 19 pandemic and precautions that should be taken.  He asked me about whether he needs to take calcium supplementation and I have told him I do not think he does but he certainly is taking vitamin D3 supplementation which I agree with.     Meds ordered this encounter  Medications  . tadalafil (CIALIS) 20 MG tablet    Sig: Take 1 tablet (20 mg total) by mouth every other day as needed for erectile dysfunction.     Dispense:  5 tablet    Refill:  0     Keneth Borg C Trena Dunavan   09/27/2019, 9:24 AM

## 2019-09-28 LAB — CBC
HCT: 58.8 % — ABNORMAL HIGH (ref 38.5–50.0)
Hemoglobin: 19.6 g/dL — ABNORMAL HIGH (ref 13.2–17.1)
MCH: 28.4 pg (ref 27.0–33.0)
MCHC: 33.3 g/dL (ref 32.0–36.0)
MCV: 85.2 fL (ref 80.0–100.0)
MPV: 13 fL — ABNORMAL HIGH (ref 7.5–12.5)
Platelets: 193 10*3/uL (ref 140–400)
RBC: 6.9 10*6/uL — ABNORMAL HIGH (ref 4.20–5.80)
RDW: 13.9 % (ref 11.0–15.0)
WBC: 9.6 10*3/uL (ref 3.8–10.8)

## 2019-09-28 LAB — TESTOSTERONE TOTAL,FREE,BIO, MALES
Albumin: 4.8 g/dL (ref 3.6–5.1)
Sex Hormone Binding: 69 nmol/L (ref 22–77)
Testosterone, Bioavailable: 307.5 ng/dL — ABNORMAL HIGH (ref 15.0–150.0)
Testosterone, Free: 140.6 pg/mL — ABNORMAL HIGH (ref 6.0–73.0)
Testosterone: 1612 ng/dL — ABNORMAL HIGH (ref 250–827)

## 2019-09-28 LAB — LIPID PANEL
Cholesterol: 192 mg/dL (ref <200)
HDL: 43 mg/dL (ref >=40)
LDL Cholesterol (Calc): 126 mg/dL (calc) — ABNORMAL HIGH
Non-HDL Cholesterol (Calc): 149 mg/dL (calc) — ABNORMAL HIGH (ref <130)
Total CHOL/HDL Ratio: 4.5 (calc) (ref <5.0)
Triglycerides: 124 mg/dL (ref <150)

## 2019-09-28 LAB — COMPLETE METABOLIC PANEL WITH GFR
AG Ratio: 1.7 (calc) (ref 1.0–2.5)
ALT: 21 U/L (ref 9–46)
AST: 19 U/L (ref 10–35)
Albumin: 4.8 g/dL (ref 3.6–5.1)
Alkaline phosphatase (APISO): 51 U/L (ref 35–144)
BUN/Creatinine Ratio: 16 (calc) (ref 6–22)
BUN: 20 mg/dL (ref 7–25)
CO2: 26 mmol/L (ref 20–32)
Calcium: 10 mg/dL (ref 8.6–10.3)
Chloride: 103 mmol/L (ref 98–110)
Creat: 1.27 mg/dL — ABNORMAL HIGH (ref 0.70–1.18)
GFR, Est African American: 66 mL/min/{1.73_m2} (ref >=60)
GFR, Est Non African American: 57 mL/min/{1.73_m2} — ABNORMAL LOW (ref >=60)
Globulin: 2.9 g/dL (calc) (ref 1.9–3.7)
Glucose, Bld: 90 mg/dL (ref 65–99)
Potassium: 5.2 mmol/L (ref 3.5–5.3)
Sodium: 141 mmol/L (ref 135–146)
Total Bilirubin: 0.5 mg/dL (ref 0.2–1.2)
Total Protein: 7.7 g/dL (ref 6.1–8.1)

## 2019-09-28 LAB — TSH: TSH: 3.06 mIU/L (ref 0.40–4.50)

## 2019-09-28 LAB — HEPATITIS C ANTIBODY
Hepatitis C Ab: NONREACTIVE
SIGNAL TO CUT-OFF: 0.02 (ref <1.00)

## 2019-09-28 LAB — VITAMIN D 25 HYDROXY (VIT D DEFICIENCY, FRACTURES): Vit D, 25-Hydroxy: 85 ng/mL (ref 30–100)

## 2019-09-28 LAB — T3, FREE: T3, Free: 3.6 pg/mL (ref 2.3–4.2)

## 2019-09-28 LAB — PSA: PSA: 3.1 ng/mL (ref <=4.0)

## 2019-10-03 ENCOUNTER — Encounter (INDEPENDENT_AMBULATORY_CARE_PROVIDER_SITE_OTHER): Payer: Self-pay | Admitting: Internal Medicine

## 2019-10-03 ENCOUNTER — Encounter (INDEPENDENT_AMBULATORY_CARE_PROVIDER_SITE_OTHER): Payer: Self-pay

## 2019-10-03 ENCOUNTER — Other Ambulatory Visit (INDEPENDENT_AMBULATORY_CARE_PROVIDER_SITE_OTHER): Payer: Self-pay

## 2019-10-03 DIAGNOSIS — N529 Male erectile dysfunction, unspecified: Secondary | ICD-10-CM

## 2019-10-03 MED ORDER — TADALAFIL 20 MG PO TABS: 20.0000 mg | ORAL_TABLET | ORAL | 2 refills | Status: DC | PRN

## 2019-10-03 NOTE — Telephone Encounter (Signed)
Alecia Lemming, Nimish C, MD 56 minutes ago (2:20 PM)   The Cialis works much better.  Can I get a prescription of the 20's at 20-30 volume.tadalafil 20 MG tablet  Can you give me a prescription for 20-30.  Thanks Khyren  They work much better Judson Roch please advise?

## 2019-10-03 NOTE — Telephone Encounter (Signed)
Routing to Nellie to call

## 2019-10-03 NOTE — Telephone Encounter (Signed)
Please notify this patient that I will refill his Cialis to the [facility] in [place].  I will prescribe 10 pills with 2 refills.  Please emphasized that this medication should only be taken every other day as it has a long half-life.  Thank you.

## 2019-10-04 NOTE — Telephone Encounter (Signed)
Nellie, were you able to call this patient and discuss his refill with him? I sent the cialis refill but I wanted to make sure that he was told to only take this as needed and no more frequently than every other day. Thank you.

## 2019-10-10 NOTE — Progress Notes (Signed)
Patient called.  Asked trio please review his message and note from Dr Anastasio Champion.

## 2019-11-24 ENCOUNTER — Other Ambulatory Visit (INDEPENDENT_AMBULATORY_CARE_PROVIDER_SITE_OTHER): Payer: Self-pay | Admitting: Internal Medicine

## 2019-11-30 ENCOUNTER — Other Ambulatory Visit (INDEPENDENT_AMBULATORY_CARE_PROVIDER_SITE_OTHER): Payer: Self-pay | Admitting: Internal Medicine

## 2020-01-09 ENCOUNTER — Ambulatory Visit (INDEPENDENT_AMBULATORY_CARE_PROVIDER_SITE_OTHER): Payer: PPO | Admitting: Nurse Practitioner

## 2020-01-09 ENCOUNTER — Other Ambulatory Visit (INDEPENDENT_AMBULATORY_CARE_PROVIDER_SITE_OTHER): Payer: Self-pay | Admitting: Nurse Practitioner

## 2020-02-02 ENCOUNTER — Other Ambulatory Visit (INDEPENDENT_AMBULATORY_CARE_PROVIDER_SITE_OTHER): Payer: Self-pay | Admitting: Nurse Practitioner

## 2020-02-02 DIAGNOSIS — N529 Male erectile dysfunction, unspecified: Secondary | ICD-10-CM

## 2020-02-02 MED ORDER — TADALAFIL 20 MG PO TABS: 20.0000 mg | ORAL_TABLET | ORAL | 1 refills | Status: DC | PRN

## 2020-03-27 ENCOUNTER — Encounter (INDEPENDENT_AMBULATORY_CARE_PROVIDER_SITE_OTHER): Payer: Self-pay | Admitting: Internal Medicine

## 2020-03-27 ENCOUNTER — Ambulatory Visit (INDEPENDENT_AMBULATORY_CARE_PROVIDER_SITE_OTHER): Payer: PPO | Admitting: Internal Medicine

## 2020-03-27 ENCOUNTER — Other Ambulatory Visit: Payer: Self-pay

## 2020-03-27 VITALS — BP 155/85 | HR 85 | Temp 98.1°F | Ht 69.0 in | Wt 177.6 lb

## 2020-03-27 DIAGNOSIS — R6882 Decreased libido: Secondary | ICD-10-CM | POA: Diagnosis not present

## 2020-03-27 DIAGNOSIS — E782 Mixed hyperlipidemia: Secondary | ICD-10-CM | POA: Diagnosis not present

## 2020-03-27 DIAGNOSIS — S233XXA Sprain of ligaments of thoracic spine, initial encounter: Secondary | ICD-10-CM | POA: Diagnosis not present

## 2020-03-27 DIAGNOSIS — S134XXA Sprain of ligaments of cervical spine, initial encounter: Secondary | ICD-10-CM | POA: Diagnosis not present

## 2020-03-27 DIAGNOSIS — N529 Male erectile dysfunction, unspecified: Secondary | ICD-10-CM

## 2020-03-27 DIAGNOSIS — M9901 Segmental and somatic dysfunction of cervical region: Secondary | ICD-10-CM | POA: Diagnosis not present

## 2020-03-27 DIAGNOSIS — M9902 Segmental and somatic dysfunction of thoracic region: Secondary | ICD-10-CM | POA: Diagnosis not present

## 2020-03-27 DIAGNOSIS — I10 Essential (primary) hypertension: Secondary | ICD-10-CM | POA: Diagnosis not present

## 2020-03-27 DIAGNOSIS — S338XXA Sprain of other parts of lumbar spine and pelvis, initial encounter: Secondary | ICD-10-CM | POA: Diagnosis not present

## 2020-03-27 DIAGNOSIS — M9903 Segmental and somatic dysfunction of lumbar region: Secondary | ICD-10-CM | POA: Diagnosis not present

## 2020-03-27 MED ORDER — ALBUTEROL SULFATE HFA 108 (90 BASE) MCG/ACT IN AERS: 2.0000 | INHALATION_SPRAY | Freq: Four times a day (QID) | RESPIRATORY_TRACT | 1 refills | Status: DC | PRN

## 2020-03-27 NOTE — Progress Notes (Signed)
Metrics: Intervention Frequency ACO  Documented Smoking Status Yearly  Screened one or more times in 24 months  Cessation Counseling or  Active cessation medication Past 24 months  Past 24 months   Guideline developer: UpToDate (See UpToDate for funding source) Date Released: 2014       Wellness Office Visit  Subjective:  Patient ID: Benjamin Todd, male    DOB: 05/07/1951  Age: 69 y.o. MRN: [id number]  CC: This man comes in for follow-up of erectile dysfunction, decreased libido, hyperlipidemia, hypertension. HPI  He is doing reasonably well.  His blood pressure has not usually required medications and his home readings are much better than when he comes in the office. He continues on testosterone therapy which has been very beneficial for him. When we did blood work on him 6 months ago, everything was in a good range except his creatinine was slightly elevated and since that time, he has been drinking lots of water every day.  He feels great and he tries to eat healthy and he tends to walk every day 1 to 2 miles. He does suffer from allergies and although he is better now, he did have some wheezing with seasonal allergies.  He is fully vaccinated from COVID-19. Past Medical History:  Diagnosis Date  . Decreased libido 05/18/2019  . Essential hypertension, benign   . Vitamin D deficiency disease    History reviewed. No pertinent surgical history.   Family History  Problem Relation Age of Onset  . Cancer Mother        breast  . 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   Social History   Tobacco Use  . Smoking status: Former Smoker    Types: Cigars    Quit date: 08/11/2017    Years since quitting: 2.6  . Smokeless tobacco: Never Used  Substance Use Topics  . Alcohol use: Not Currently    Current Meds  Medication Sig  . Cholecalciferol (VITAMIN D3) 125 MCG (5000 UT)  TABS Take 2 tablets by mouth daily.  . tadalafil (CIALIS) 20 MG tablet TAKE 1 TABLET BY MOUTH EVERY OTHER DAY AS NEEDED FOR ERECTILE DYSFUNCTION  . testosterone cypionate (DEPOTESTOSTERONE CYPIONATE) 200 MG/ML injection INJECT 0.5 ML INTRAMUSCULARLY ONCE A WEEK      No flowsheet data found.   Objective:   Today's Vitals: BP (!) 155/85 (BP Location: Left Arm, Patient Position: Sitting, Cuff Size: Normal)   Pulse 85   Temp 98.1 F (36.7 C)   Ht 5\' 9"  (1.753 m)   Wt 177 lb 9.6 oz (80.6 kg)   SpO2 97%   BMI 26.23 kg/m  Vitals with BMI 03/27/2020 09/27/2019 05/18/2019  Height 5\' 9"  5\' 9"  5\' 9"   Weight 177 lbs 10 oz 178 lbs 10 oz 178 lbs 10 oz  BMI 26.22 46.96 29.52  Systolic 841 324 401  Diastolic 85 70 90  Pulse 85 82 72     Physical Exam   He looks systemically well.  Weight is steady.  Lung fields are entirely clear with no evidence of wheezing.    Assessment   1. Essential hypertension   2. Erectile dysfunction, unspecified erectile dysfunction type   3. Decreased libido   4. Mixed hyperlipidemia       Tests ordered No orders of the defined types were placed in this encounter.    Plan: 1. We will continue  to monitor his hypertension without medications for the time being. 2. He will continue with testosterone therapy as before and I do not think we need to change the dose. 3. He will continue to work on diet and exercise in terms of his hyperlipidemia and we will check his lipid panel next time when I see him for an annual physical. 4. Annual physical in about 6 months.   Meds ordered this encounter  Medications  . albuterol (VENTOLIN HFA) 108 (90 Base) MCG/ACT inhaler    Sig: Inhale 2 puffs into the lungs every 6 (six) hours as needed for wheezing or shortness of breath.    Dispense:  18 g    Refill:  1    Cedrik Heindl Luther Parody, MD

## 2020-04-04 ENCOUNTER — Other Ambulatory Visit: Payer: Self-pay

## 2020-04-04 ENCOUNTER — Ambulatory Visit (INDEPENDENT_AMBULATORY_CARE_PROVIDER_SITE_OTHER): Payer: PPO | Admitting: Nurse Practitioner

## 2020-04-04 ENCOUNTER — Encounter (INDEPENDENT_AMBULATORY_CARE_PROVIDER_SITE_OTHER): Payer: Self-pay | Admitting: Nurse Practitioner

## 2020-04-04 ENCOUNTER — Telehealth (INDEPENDENT_AMBULATORY_CARE_PROVIDER_SITE_OTHER): Payer: Self-pay | Admitting: Nurse Practitioner

## 2020-04-04 VITALS — BP 135/78 | HR 74 | Ht 69.0 in | Wt 177.0 lb

## 2020-04-04 DIAGNOSIS — Z0000 Encounter for general adult medical examination without abnormal findings: Secondary | ICD-10-CM | POA: Diagnosis not present

## 2020-04-04 NOTE — Patient Instructions (Signed)
°  Benjamin Todd , Thank you for taking time to come for your Medicare Wellness Visit. I appreciate your ongoing commitment to your health goals. Please review the following plan we discussed and let me know if I can assist you in the future.   These are the goals we discussed: Goals   None     This is a list of the screening recommended for you and due dates:  Health Maintenance  Topic Date Due   Flu Shot  03/18/2020   Colon Cancer Screening  09/26/2020*   Tetanus Vaccine  09/26/2029   COVID-19 Vaccine  Completed    Hepatitis C: One time screening is recommended by Center for Disease Control  (CDC) for  adults born from [birth year] through 1967.   Completed   Pneumonia vaccines  Completed  *Topic was postponed. The date shown is not the original due date.

## 2020-04-04 NOTE — Telephone Encounter (Signed)
Please print and mail after visit summary to patient from appointment on 04/04/2020.  Thank you.

## 2020-04-04 NOTE — Progress Notes (Signed)
Due to national recommendations of social distancing related to the [place] pandemic, an audio/visual tele-health visit was felt to be the most appropriate encounter type for this patient today. I connected with  Benjamin Todd on 04/04/20 utilizing audio-only technology and verified that I am speaking with the correct person using two identifiers. The patient was located at their at grandchildren's home in [place], and I was located at the office of [facility] during the encounter. I discussed the limitations of evaluation and management by telemedicine. The patient expressed understanding and agreed to proceed.  Patient did not have access to technology that would allow him to proceed with visual aid, thus audio only visit was conducted today.   Subjective:   Benjamin Todd is a 69 y.o. male who presents for Medicare Annual/Subsequent preventive examination.   Cardiac Risk Factors include: none     Objective:    Today's Vitals   04/04/20 0941  BP: 135/78  Pulse: 74  Weight: 177 lb (80.3 kg)  Height: 5\' 9"  (1.753 m)   Body mass index is 26.14 kg/m.  Advanced Directives 04/04/2020  Does Patient Have a Medical Advance Directive? No  Would patient like information on creating a medical advance directive? No - Patient declined    Current Medications (verified) Outpatient Encounter Medications as of 04/04/2020  Medication Sig  . albuterol (VENTOLIN HFA) 108 (90 Base) MCG/ACT inhaler Inhale 2 puffs into the lungs every 6 (six) hours as needed for wheezing or shortness of breath.  . Cholecalciferol (VITAMIN D3) 125 MCG (5000 UT) TABS Take 2 tablets by mouth daily.  . tadalafil (CIALIS) 20 MG tablet TAKE 1 TABLET BY MOUTH EVERY OTHER DAY AS NEEDED FOR ERECTILE DYSFUNCTION  . testosterone cypionate (DEPOTESTOSTERONE CYPIONATE) 200 MG/ML injection INJECT 0.5 ML INTRAMUSCULARLY ONCE A WEEK   No facility-administered encounter medications on file as of 04/04/2020.     Allergies (verified) Patient has no known allergies.   History: Past Medical History:  Diagnosis Date  . Decreased libido 05/18/2019  . Essential hypertension, benign   . Vitamin D deficiency disease    History reviewed. No pertinent surgical history. Family History  Problem Relation Age of Onset  . Cancer Mother        breast  . Hypertension Father    Social History   Socioeconomic History  . Marital status: Single    Spouse name: Not on file  . Number of children: Not on file  . Years of education: Not on file  . Highest education level: Not on file  Occupational History  . Not on file  Tobacco Use  . Smoking status: Former Smoker    Types: Cigars    Quit date: 08/11/2017    Years since quitting: 2.6  . Smokeless tobacco: Never Used  Substance and Sexual Activity  . Alcohol use: Not Currently  . Drug use: Never  . Sexual activity: Yes  Other Topics Concern  . Not on file  Social History Narrative   Married for since 1979.Lives with wife and son.Occ substitute Pharmacist, hospital.   Social Determinants of Health   Financial Resource Strain:   . Difficulty of Paying Living Expenses:   Food Insecurity:   . Worried About Charity fundraiser in the Last Year:   . Arboriculturist in the Last Year:   Transportation Needs:   . Film/video editor (Medical):   Marland Kitchen Lack of Transportation (Non-Medical):   Physical Activity:   . Days of  Exercise per Week:   . Minutes of Exercise per Session:   Stress:   . Feeling of Stress :   Social Connections:   . Frequency of Communication with Friends and Family:   . Frequency of Social Gatherings with Friends and Family:   . Attends Religious Services:   . Active Member of Clubs or Organizations:   . Attends Archivist Meetings:   Marland Kitchen Marital Status:     Tobacco Counseling Counseling given: Not Answered   Clinical Intake:  Pre-visit preparation completed: Yes  Pain : No/denies pain     Nutritional Status:  BMI > 30  Obese Diabetes: No  How often do you need to have someone help you when you read instructions, pamphlets, or other written materials from your doctor or pharmacy?: 1 - Never What is the last grade level you completed in school?: Bachelors Degree  Diabetic? No  Interpreter Needed?: No  Information entered by :: Benjamin Todd, CMA   Activities of Daily Living In your present state of health, do you have any difficulty performing the following activities: 04/04/2020  Hearing? N  Vision? Y  Comment wears glasses  Difficulty concentrating or making decisions? N  Walking or climbing stairs? N  Dressing or bathing? N  Doing errands, shopping? N  Preparing Food and eating ? N  Using the Toilet? N  In the past six months, have you accidently leaked urine? N  Do you have problems with loss of bowel control? N  Managing your Medications? N  Managing your Finances? N  Housekeeping or managing your Housekeeping? N  Some recent data might be hidden    Patient Care Team: Doree Albee, MD as PCP - General (Internal Medicine)  Indicate any recent Medical Services you may have received from other than [facility] providers in the past year (date may be approximate).     Assessment:   This is a routine wellness examination for Benjamin Todd.  Hearing/Vision screen No exam data present  Dietary issues and exercise activities discussed: Current Exercise Habits: Home exercise routine, Type of exercise: walking, Time (Minutes): 30, Frequency (Times/Week): 7, Weekly Exercise (Minutes/Week): 210, Intensity: Mild, Exercise limited by: None identified  Goals   None    Depression Screen PHQ 2/9 Scores 04/04/2020  PHQ - 2 Score 0  Exception Documentation Medical reason    Fall Risk Fall Risk  04/04/2020  Falls in the past year? 0  Number falls in past yr: 0  Injury with Fall? 0  Risk for fall due to : No Fall Risks  Follow up Falls evaluation completed    Any stairs in or  around the home? Yes  If so, are there any without handrails? No  Home free of loose throw rugs in walkways, pet beds, electrical cords, etc? Yes  Adequate lighting in your home to reduce risk of falls? Yes   ASSISTIVE DEVICES UTILIZED TO PREVENT FALLS:  Life alert? No  Use of a cane, walker or w/c? Yes  Grab bars in the bathroom? No  Shower chair or bench in shower? No  Elevated toilet seat or a handicapped toilet? Yes   TIMED UP AND GO:  Was the test performed? No .  Length of time to ambulate 10 feet: N/A    Cognitive Function:     6CIT Screen 04/04/2020  What Year? 0 points  What month? 0 points  What time? 0 points  Count back from 20 0 points  Months in reverse 0 points  Repeat phrase 0 points  Total Score 0    Immunizations Immunization History  Administered Date(s) Administered  . Moderna SARS-COVID-2 Vaccination 09/22/2019, 10/21/2019  . Pneumococcal Polysaccharide-23 09/04/2017  . Tdap 09/27/2019    TDAP status: Up to date Flu Vaccine status: Up to date Pneumococcal vaccine status: Up to date Covid-19 vaccine status: Completed vaccines  Qualifies for Shingles Vaccine? No   Zostavax completed No   Shingrix Completed?: No.    Education has been provided regarding the importance of this vaccine. Patient has been advised to call insurance company to determine out of pocket expense if they have not yet received this vaccine. Advised may also receive vaccine at local pharmacy or Health Dept. Verbalized acceptance and understanding.  Screening Tests Health Maintenance  Topic Date Due  . INFLUENZA VACCINE  03/18/2020  . COLONOSCOPY  09/26/2020 (Originally 05/04/1999)  . TETANUS/TDAP  09/26/2029  . COVID-19 Vaccine  Completed  . Hepatitis C Screening  Completed  . PNA vac Low Risk Adult  Completed    Health Maintenance  Health Maintenance Due  Topic Date Due  . INFLUENZA VACCINE  03/18/2020    Colon Ca Screen: patient refusing  Lung Cancer  Screening: (Low Dose CT Chest recommended if Age 26-80 years, 30 pack-year currently smoking OR have quit w/in 15years.) does not qualify.   Lung Cancer Screening Referral: N/A  Additional Screening:  Hepatitis C Screening: does not qualify; Completed 09/2019  Vision Screening: Recommended annual ophthalmology exams for early detection of glaucoma and other disorders of the eye. Is the patient up to date with their annual eye exam?  Yes  Who is the provider or what is the name of the office in which the patient attends annual eye exams? N/A If pt is not established with a provider, would they like to be referred to a provider to establish care? No .   Dental Screening: Recommended annual dental exams for proper oral hygiene  Community Resource Referral / Chronic Care Management: CRR required this visit?  No   CCM required this visit?  No      Plan:   He is due for flu and shingles vaccines, he will contemplate whether or not he would like to get these vaccines and encouraged him to let me know if you would like flu shots we can administer this in our office and let the pharmacy know if you would like a shingles vaccine.  He tells me he understands.  He is not interested in undergoing colon cancer screening at this time.  He has not smoked cigarettes in " many years", but he did smoke cigars for a little while, I do not think that he qualifies for lung cancer screening at this time based on his report that he stop smoking quite some time ago.  Will not initiate screening for now.  Depression screen completed and negative.  Fall screen completed and shows low risk for falling.  I have personally reviewed and noted the following in the patient's chart:   . Medical and social history . Use of alcohol, tobacco or illicit drugs  . Current medications and supplements . Functional ability and status . Nutritional status . Physical activity . Advanced directives . List of other  physicians . Hospitalizations, surgeries, and ER visits in previous 12 months . Vitals . Screenings to include cognitive, depression, and falls . Referrals and appointments  In addition, I have reviewed and discussed with patient certain preventive protocols, quality metrics, and best practice recommendations.  A written personalized care plan for preventive services as well as general preventive health recommendations were provided to patient.   He will follow-up for his annual physical exam as scheduled, but was encouraged to call the office with questions or concerns.  Total telephone conversation lasted for approximately 25 minutes.  Ailene Ards, NP   04/04/2020

## 2020-04-05 NOTE — Telephone Encounter (Signed)
Done

## 2020-04-06 DIAGNOSIS — M9903 Segmental and somatic dysfunction of lumbar region: Secondary | ICD-10-CM | POA: Diagnosis not present

## 2020-04-06 DIAGNOSIS — S134XXA Sprain of ligaments of cervical spine, initial encounter: Secondary | ICD-10-CM | POA: Diagnosis not present

## 2020-04-06 DIAGNOSIS — S233XXA Sprain of ligaments of thoracic spine, initial encounter: Secondary | ICD-10-CM | POA: Diagnosis not present

## 2020-04-06 DIAGNOSIS — M9901 Segmental and somatic dysfunction of cervical region: Secondary | ICD-10-CM | POA: Diagnosis not present

## 2020-04-06 DIAGNOSIS — S338XXA Sprain of other parts of lumbar spine and pelvis, initial encounter: Secondary | ICD-10-CM | POA: Diagnosis not present

## 2020-04-06 DIAGNOSIS — M9902 Segmental and somatic dysfunction of thoracic region: Secondary | ICD-10-CM | POA: Diagnosis not present

## 2020-04-18 DIAGNOSIS — M9901 Segmental and somatic dysfunction of cervical region: Secondary | ICD-10-CM | POA: Diagnosis not present

## 2020-04-18 DIAGNOSIS — M9902 Segmental and somatic dysfunction of thoracic region: Secondary | ICD-10-CM | POA: Diagnosis not present

## 2020-04-18 DIAGNOSIS — S233XXA Sprain of ligaments of thoracic spine, initial encounter: Secondary | ICD-10-CM | POA: Diagnosis not present

## 2020-04-18 DIAGNOSIS — M9903 Segmental and somatic dysfunction of lumbar region: Secondary | ICD-10-CM | POA: Diagnosis not present

## 2020-04-18 DIAGNOSIS — S134XXA Sprain of ligaments of cervical spine, initial encounter: Secondary | ICD-10-CM | POA: Diagnosis not present

## 2020-04-18 DIAGNOSIS — S338XXA Sprain of other parts of lumbar spine and pelvis, initial encounter: Secondary | ICD-10-CM | POA: Diagnosis not present

## 2020-04-20 ENCOUNTER — Other Ambulatory Visit (INDEPENDENT_AMBULATORY_CARE_PROVIDER_SITE_OTHER): Payer: Self-pay | Admitting: Internal Medicine

## 2020-05-16 ENCOUNTER — Telehealth (INDEPENDENT_AMBULATORY_CARE_PROVIDER_SITE_OTHER): Payer: PPO | Admitting: Nurse Practitioner

## 2020-05-16 ENCOUNTER — Encounter (INDEPENDENT_AMBULATORY_CARE_PROVIDER_SITE_OTHER): Payer: Self-pay | Admitting: Nurse Practitioner

## 2020-05-16 VITALS — BP 160/78 | Ht 69.0 in | Wt 178.0 lb

## 2020-05-16 DIAGNOSIS — M62838 Other muscle spasm: Secondary | ICD-10-CM | POA: Diagnosis not present

## 2020-05-16 MED ORDER — CYCLOBENZAPRINE HCL 5 MG PO TABS: 5.0000 mg | ORAL_TABLET | Freq: Three times a day (TID) | ORAL | 1 refills | Status: DC | PRN
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
## 2020-06-11 ENCOUNTER — Telehealth (INDEPENDENT_AMBULATORY_CARE_PROVIDER_SITE_OTHER): Payer: Self-pay
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
## 2020-06-13 ENCOUNTER — Encounter (INDEPENDENT_AMBULATORY_CARE_PROVIDER_SITE_OTHER): Payer: Self-pay | Admitting: Internal Medicine

## 2020-06-13 ENCOUNTER — Other Ambulatory Visit: Payer: Self-pay

## 2020-06-13 ENCOUNTER — Ambulatory Visit (INDEPENDENT_AMBULATORY_CARE_PROVIDER_SITE_OTHER): Payer: PPO | Admitting: Internal Medicine

## 2020-06-13 VITALS — BP 144/88 | HR 90 | Temp 98.2°F | Ht 69.0 in | Wt 180.6 lb

## 2020-06-13 DIAGNOSIS — N529 Male erectile dysfunction, unspecified: Secondary | ICD-10-CM

## 2020-06-13 DIAGNOSIS — M62838 Other muscle spasm: Secondary | ICD-10-CM | POA: Diagnosis not present

## 2020-06-13 MED ORDER — PREDNISONE 20 MG PO TABS: 40.0000 mg | ORAL_TABLET | Freq: Every day | ORAL | 1 refills | Status: DC

## 2020-06-13 MED ORDER — TADALAFIL 20 MG PO TABS: 20.0000 mg | ORAL_TABLET | Freq: Every day | ORAL | 3 refills | Status: DC | PRN
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
## 2020-06-18 ENCOUNTER — Encounter: Payer: Self-pay | Admitting: Physical Medicine and Rehabilitation

## 2020-06-19 ENCOUNTER — Other Ambulatory Visit (INDEPENDENT_AMBULATORY_CARE_PROVIDER_SITE_OTHER): Payer: Self-pay | Admitting: Internal Medicine

## 2020-06-19 MED ORDER — PREDNISONE 20 MG PO TABS
40.0000 mg | ORAL_TABLET | Freq: Every day | ORAL | 1 refills | Status: DC
Start: 2020-06-19 — End: 2020-08-27

## 2020-07-16 ENCOUNTER — Encounter: Payer: PPO | Attending: Physical Medicine and Rehabilitation | Admitting: Physical Medicine and Rehabilitation

## 2020-07-16 ENCOUNTER — Other Ambulatory Visit: Payer: Self-pay

## 2020-07-16 ENCOUNTER — Encounter: Payer: Self-pay | Admitting: Physical Medicine and Rehabilitation

## 2020-07-16 VITALS — BP 164/90 | HR 95 | Temp 98.1°F | Ht 69.0 in | Wt 181.2 lb

## 2020-07-16 DIAGNOSIS — G959 Disease of spinal cord, unspecified: Secondary | ICD-10-CM | POA: Insufficient documentation

## 2020-07-16 DIAGNOSIS — G9520 Unspecified cord compression: Secondary | ICD-10-CM | POA: Insufficient documentation

## 2020-07-16 DIAGNOSIS — R159 Full incontinence of feces: Secondary | ICD-10-CM | POA: Insufficient documentation

## 2020-07-16 DIAGNOSIS — S14101A Unspecified injury at C1 level of cervical spinal cord, initial encounter: Secondary | ICD-10-CM | POA: Diagnosis not present

## 2020-07-16 MED ORDER — DIAZEPAM 10 MG PO TABS: 20.0000 mg | ORAL_TABLET | Freq: Once | ORAL | 0 refills | Status: AC
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
## 2020-08-02 DIAGNOSIS — H2513 Age-related nuclear cataract, bilateral: Secondary | ICD-10-CM | POA: Diagnosis not present

## 2020-08-02 DIAGNOSIS — H40033 Anatomical narrow angle, bilateral: Secondary | ICD-10-CM | POA: Diagnosis not present

## 2020-08-07 ENCOUNTER — Ambulatory Visit (HOSPITAL_COMMUNITY)
Admission: RE | Admit: 2020-08-07 | Discharge: 2020-08-07 | Disposition: A | Discharge status: Home / Self Care | Payer: PPO | Source: Ambulatory Visit | Attending: Physical Medicine and Rehabilitation | Admitting: Physical Medicine and Rehabilitation

## 2020-08-07 ENCOUNTER — Other Ambulatory Visit: Payer: Self-pay

## 2020-08-07 DIAGNOSIS — M5126 Other intervertebral disc displacement, lumbar region: Secondary | ICD-10-CM | POA: Diagnosis not present

## 2020-08-07 DIAGNOSIS — M5124 Other intervertebral disc displacement, thoracic region: Secondary | ICD-10-CM | POA: Diagnosis not present

## 2020-08-07 DIAGNOSIS — S3991XA Unspecified injury of abdomen, initial encounter: Secondary | ICD-10-CM | POA: Diagnosis not present

## 2020-08-07 DIAGNOSIS — F40240 Claustrophobia: Secondary | ICD-10-CM | POA: Diagnosis not present

## 2020-08-07 DIAGNOSIS — G9589 Other specified diseases of spinal cord: Secondary | ICD-10-CM | POA: Diagnosis not present

## 2020-08-07 DIAGNOSIS — G959 Disease of spinal cord, unspecified: Secondary | ICD-10-CM

## 2020-08-07 DIAGNOSIS — Z8669 Personal history of other diseases of the nervous system and sense organs: Secondary | ICD-10-CM | POA: Diagnosis not present

## 2020-08-07 DIAGNOSIS — R531 Weakness: Secondary | ICD-10-CM | POA: Diagnosis not present

## 2020-08-07 DIAGNOSIS — M4802 Spinal stenosis, cervical region: Secondary | ICD-10-CM | POA: Diagnosis not present

## 2020-08-07 DIAGNOSIS — M4804 Spinal stenosis, thoracic region: Secondary | ICD-10-CM | POA: Diagnosis not present

## 2020-08-07 DIAGNOSIS — M48061 Spinal stenosis, lumbar region without neurogenic claudication: Secondary | ICD-10-CM | POA: Diagnosis not present

## 2020-08-07 DIAGNOSIS — M47814 Spondylosis without myelopathy or radiculopathy, thoracic region: Secondary | ICD-10-CM | POA: Diagnosis not present

## 2020-08-27 ENCOUNTER — Ambulatory Visit
Admission: RE | Admit: 2020-08-27 | Discharge: 2020-08-27 | Disposition: A | Discharge status: Home / Self Care | Payer: PPO | Source: Ambulatory Visit | Attending: Physical Medicine and Rehabilitation | Admitting: Physical Medicine and Rehabilitation

## 2020-08-27 ENCOUNTER — Other Ambulatory Visit: Payer: Self-pay

## 2020-08-27 ENCOUNTER — Encounter: Payer: Self-pay | Admitting: Physical Medicine and Rehabilitation

## 2020-08-27 ENCOUNTER — Encounter: Payer: PPO | Attending: Physical Medicine and Rehabilitation | Admitting: Physical Medicine and Rehabilitation

## 2020-08-27 VITALS — BP 157/71 | HR 76 | Temp 97.6°F | Ht 69.0 in | Wt 178.0 lb

## 2020-08-27 DIAGNOSIS — M25512 Pain in left shoulder: Secondary | ICD-10-CM | POA: Diagnosis not present

## 2020-08-27 DIAGNOSIS — G9589 Other specified diseases of spinal cord: Secondary | ICD-10-CM | POA: Diagnosis not present

## 2020-08-27 DIAGNOSIS — M19012 Primary osteoarthritis, left shoulder: Secondary | ICD-10-CM | POA: Diagnosis not present

## 2020-08-27 MED ORDER — PREDNISONE 5 MG PO TABS: 20.0000 mg | ORAL_TABLET | Freq: Every day | ORAL | 0 refills | Status: DC

## 2020-08-27 NOTE — Addendum Note (Signed)
Addended by: Courtney Heys on: 08/27/2020 01:41 PM   Modules accepted: Orders
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
## 2020-09-06 ENCOUNTER — Other Ambulatory Visit (INDEPENDENT_AMBULATORY_CARE_PROVIDER_SITE_OTHER): Payer: Self-pay | Admitting: Internal Medicine

## 2020-09-06 ENCOUNTER — Encounter (INDEPENDENT_AMBULATORY_CARE_PROVIDER_SITE_OTHER): Payer: Self-pay | Admitting: Internal Medicine

## 2020-09-06 MED ORDER — TESTOSTERONE CYPIONATE 200 MG/ML IM SOLN: INTRAMUSCULAR | 1 refills | Status: DC

## 2020-09-07 MED ORDER — TRAMADOL HCL 50 MG PO TABS: 50.0000 mg | ORAL_TABLET | Freq: Four times a day (QID) | ORAL | 0 refills | Status: DC | PRN

## 2020-09-07 NOTE — Telephone Encounter (Signed)
Pt's shoulder really bothering him- wondering if there's anything to help the toothache type pain- his last Cr was 1.27- 1 year ago- so will not Rx for oral NSAIDs- will try tramadol 50 mg 4x/day as needed- 7 days supply since required for new Rx's.   # 28 pills 50 mg QID prn Sent to [facility] in [place], [place]- call me back if helpful or not.  Also , can try voltaren gel over the counter up to 4x/day as needed for shoulder pain.

## 2020-09-11 ENCOUNTER — Ambulatory Visit: Payer: PPO | Attending: Physical Medicine and Rehabilitation | Admitting: Physical Therapy

## 2020-09-11 ENCOUNTER — Encounter: Payer: Self-pay | Admitting: Physical Therapy

## 2020-09-11 ENCOUNTER — Telehealth: Payer: Self-pay | Admitting: Physical Therapy

## 2020-09-11 ENCOUNTER — Other Ambulatory Visit: Payer: Self-pay

## 2020-09-11 DIAGNOSIS — R293 Abnormal posture: Secondary | ICD-10-CM | POA: Insufficient documentation

## 2020-09-11 DIAGNOSIS — M542 Cervicalgia: Secondary | ICD-10-CM | POA: Insufficient documentation

## 2020-09-11 DIAGNOSIS — R29898 Other symptoms and signs involving the musculoskeletal system: Secondary | ICD-10-CM | POA: Insufficient documentation

## 2020-09-11 DIAGNOSIS — M25512 Pain in left shoulder: Secondary | ICD-10-CM | POA: Diagnosis not present

## 2020-09-11 DIAGNOSIS — M6281 Muscle weakness (generalized): Secondary | ICD-10-CM | POA: Diagnosis not present
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
## 2020-09-11 NOTE — Telephone Encounter (Signed)
Dr. Dagoberto Ligas,  Benjamin Todd was evaluated by Physical Therapy on 09/11/20.  The patient would benefit from an OT evaluation due to L shoulder pain, decr LUE strength/ROM, and decr grip strength. If you agree, please place an order in [facility] in [facility] or fax the order to [phone].  Thank you, Janann August, PT, DPT 09/11/20 2:10 PM    [facility] [address] [place], [place]  [address] Phone:  [phone] Fax:  [phone]

## 2020-09-21 DIAGNOSIS — H25811 Combined forms of age-related cataract, right eye: Secondary | ICD-10-CM | POA: Diagnosis not present

## 2020-09-21 DIAGNOSIS — H52221 Regular astigmatism, right eye: Secondary | ICD-10-CM | POA: Diagnosis not present

## 2020-09-25 ENCOUNTER — Other Ambulatory Visit: Payer: Self-pay

## 2020-09-25 ENCOUNTER — Ambulatory Visit: Payer: PPO | Attending: Physical Medicine and Rehabilitation

## 2020-09-25 DIAGNOSIS — M25512 Pain in left shoulder: Secondary | ICD-10-CM | POA: Diagnosis not present

## 2020-09-25 DIAGNOSIS — M542 Cervicalgia: Secondary | ICD-10-CM | POA: Insufficient documentation

## 2020-09-25 DIAGNOSIS — M6281 Muscle weakness (generalized): Secondary | ICD-10-CM | POA: Insufficient documentation

## 2020-09-25 DIAGNOSIS — R531 Weakness: Secondary | ICD-10-CM | POA: Insufficient documentation

## 2020-09-25 DIAGNOSIS — R293 Abnormal posture: Secondary | ICD-10-CM | POA: Diagnosis not present

## 2020-09-25 DIAGNOSIS — R2681 Unsteadiness on feet: Secondary | ICD-10-CM | POA: Insufficient documentation

## 2020-09-25 DIAGNOSIS — M47812 Spondylosis without myelopathy or radiculopathy, cervical region: Secondary | ICD-10-CM | POA: Diagnosis not present

## 2020-09-25 NOTE — Patient Instructions (Signed)
Consider resuming prednisone or OTC anti-inflammatories per his prescription/OTC use to assess pain relief

## 2020-09-26 NOTE — Therapy (Signed)
[facility] [address] [place], [place], [address] Phone: [phone]   Fax:  [phone]  Physical Therapy Treatment  Patient Details  Name: Benjamin Todd MRN: [id number] Date of Birth: [birth year] Referring Provider (PT): Courtney Heys, MD   Encounter Date: 09/25/2020   PT End of Session - 09/25/20 1337    Visit Number 2    Number of Visits 9    Date for PT Re-Evaluation 12/10/20    Authorization Type [employer]    PT Start Time 1230    PT Stop Time 1315    PT Time Calculation (min) 45 min    Activity Tolerance Patient tolerated treatment well    Behavior During Therapy [facility] for tasks assessed/performed           Past Medical History:  Diagnosis Date  . Decreased libido 05/18/2019  . Essential hypertension, benign   . Vitamin D deficiency disease     History reviewed. No pertinent surgical history.  There were no vitals filed for this visit.   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  X-RAY of L shoulder 08/27/20: Negative for acute bony abnormality. Degenerative changes of the [facility] joint and [place] joint    Patient Stated Goals wants to stop the pain in his shoulder    Currently in Pain? Yes    Pain Score 4     Pain Location Shoulder    Pain Orientation Left    Pain Descriptors / Indicators Sharp    Pain Type Acute pain    Pain Onset More than a month ago    Pain  Frequency Intermittent    Aggravating Factors  OH and behind the back reaching    Pain Relieving Factors rest                        09/25/20 0001  Bed Mobility  Bed Mobility  (asiste patient with 10 reps shoulder flexion with manual therapy to maintain humeral head in glenoid and promote proper mechanics)  Shoulder Exercises: Supine  Flexion AROM  Shoulder Exercises: Standing  Other Standing Exercises standing in front of mirror, had patient perform scaption B and recognize excessive upward rotation and promote corrective measures  Manual Therapy  Manual Therapy Joint mobilization  Manual therapy comments performed  lngth assessment of l shoulder complex to find decreased scapular mobility directlyimpacting scapulohumeralmechanics, mobilizations consisted of postererior and downward humeral head mobilizations, 2x10 in 0d abd, followed by same in 90d abduction, R sidelie resisted scapular elev, dep, prot, retraction for 10 reps each, light resistance, promoted manual scapular winging to dissociate scapula from rib cage.                    PT Short Term Goals - 09/11/20 1429      PT SHORT TERM GOAL #1   Title ALL STGS = LTGS  PT Long Term Goals - 09/11/20 1438      PT LONG TERM GOAL #1   Title Pt will be independent with final HEP in order to build upon functional gains made in therapy. ALL LTGS DUE 10/16/20    Time 5   due to delay in scheduling   Period Weeks    Status New    Target Date 10/16/20      PT LONG TERM GOAL #2   Title DASH or NDI to be performed with goal written as appropriate.    Time 5    Period Weeks    Status New      PT LONG TERM GOAL #3   Title Pt will improve cervical rotation AROM to at least 55 degrees to the R and 45 degrees to the L in order to demo improved ROM for driving.    Baseline 42 deg to R, 22 deg to L    Time 5    Period Weeks    Status New      PT LONG TERM GOAL #4   Title Pt will improve R  side bending AROM to at least 20 degrees with 2/10 or less pain.    Baseline 10 degrees with 5/10 pain on L side    Time 5    Period Weeks    Status New      PT LONG TERM GOAL #5   Title Pt will improve L shoulder flexion AROM to at least 115 degrees and L shoulder ABD AROM to at least 110 degrees in order to demo improved ROM for IADLS such as dressing.    Baseline L shoulder flexion 90 degrees with 5/10 pain, L shoulder ABD 85 degrees with 7/10 pain    Time 5    Period Weeks    Status New                 Plan - 09/25/20 1338    Clinical Impression Statement patient reporting decreased and localized L shoulder pain and resolution of radiating symptoms but continued decreased L grip strength, he presents with S&S of secondary impingement as well as decreased scapular mobility creating soft tissue irritation, patient much improved follwing session and able to raise arm OH w/less discomfort    Personal Factors and Comorbidities Comorbidity 2    Comorbidities HTN, HLD    Examination-Activity Limitations Hygiene/Grooming;Dressing;Reach Overhead    Examination-Participation Restrictions Driving    Stability/Clinical Decision Making Evolving/Moderate complexity    Rehab Potential Good    PT Frequency 2x / week    PT Duration 4 weeks    PT Treatment/Interventions ADLs/Self Care Home Management;Functional mobility training;Therapeutic activities;Therapeutic exercise;Neuromuscular re-education;Balance training;Patient/family education;Manual techniques;Passive range of motion;Vestibular    PT Next Visit Plan perform DASH or NDI with goal written. f/u with manual technique to improve shoulder mobility and function, check pain levels    Consulted and Agree with Plan of Care Patient           Patient will benefit from skilled therapeutic intervention in order to improve the following deficits and impairments:  Decreased coordination,Decreased range of motion,Decreased strength,Impaired  sensation,Impaired UE functional use,Postural dysfunction,Pain,Decreased mobility  Visit Diagnosis: Muscle weakness (generalized)  Abnormal posture  Acute pain of left shoulder     Problem List Patient Active Problem List   Diagnosis Date Noted  . Full incontinence of feces 07/16/2020  . 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 Disease of spinal cord ([place]) 07/16/2020  .  Decreased libido 05/18/2019  . Essential hypertension 04/16/2016  . COPD exacerbation ([place]) 04/16/2016  . Cervical spine arthritis 05/09/2013  . Sensory neuropathy 05/09/2013  . HLD (hyperlipidemia) 05/09/2013    Lanice Shirts PT 09/26/2020, 8:06 AM  [facility] [address] [place], [place], [address] Phone: [phone]   Fax:  [phone]  Name: Benjamin Todd MRN: [id number] Date of Birth: September 05, 1950

## 2020-09-28 ENCOUNTER — Ambulatory Visit: Payer: PPO

## 2020-09-28 ENCOUNTER — Other Ambulatory Visit: Payer: Self-pay

## 2020-09-28 DIAGNOSIS — R293 Abnormal posture: Secondary | ICD-10-CM

## 2020-09-28 DIAGNOSIS — M47812 Spondylosis without myelopathy or radiculopathy, cervical region: Secondary | ICD-10-CM

## 2020-09-28 DIAGNOSIS — M6281 Muscle weakness (generalized): Secondary | ICD-10-CM | POA: Diagnosis not present

## 2020-09-28 DIAGNOSIS — M542 Cervicalgia: Secondary | ICD-10-CM

## 2020-09-28 NOTE — Therapy (Signed)
[facility] [address] [place], [place], [address] Phone: [phone]   Fax:  [phone]  Physical Therapy Treatment  Patient Details  Name: Benjamin Todd MRN: [id number] Date of Birth: 1951-07-09 Referring Provider (PT): Courtney Heys, MD   Encounter Date: 09/28/2020   PT End of Session - 09/28/20 1330    Visit Number 3    Number of Visits 9    Date for PT Re-Evaluation 12/10/20    Authorization Type [employer]    PT Start Time 1230    PT Stop Time 1320    PT Time Calculation (min) 50 min    Activity Tolerance Patient tolerated treatment well    Behavior During Therapy [facility] for tasks assessed/performed           Past Medical History:  Diagnosis Date  . Decreased libido 05/18/2019  . Essential hypertension, benign   . Vitamin D deficiency disease     History reviewed. No pertinent surgical history.  There were no vitals filed for this visit.   Subjective Assessment - 09/28/20 1235    Subjective has been compliant with HEP of scaption and promoting scapular depression, feel less pain and reports improved shoulder and neck mobility    Pertinent History PMH: HTN, HLD    Limitations Lifting    Diagnostic tests MRI 08/07/20: of cervical spine shows moderate ot severe neural foraminal narrowing at C5/6 and C6/7 and myelomalacia at C5/6 with T2 hyperintense signal at C5/6.  X-RAY of L shoulder 08/27/20: Negative for acute bony abnormality. Degenerative changes of the [facility] joint and [address] joint    Patient Stated Goals wants to stop the pain in his shoulder    Currently in Pain? Yes    Pain Score 2     Pain Location Shoulder    Pain Orientation Left    Pain Descriptors / Indicators Aching    Pain Onset More than a month ago                             [facility] Adult PT Treatment/Exercise - 09/28/20 0001      Shoulder Exercises: Sidelying   Other Sidelying Exercises resisted L  scapular prot, ret, elev and depression, 10 reps light resistance hand on blockin 90d flexion to promote neutral hor abd      Manual Therapy   Manual Therapy Joint mobilization;Scapular mobilization    Manual therapy comments performed L sapular ROM nd soft tissue mobilization to promoted mobility, light resustance in all 4 planes.  Peformed scalene stertches to all 3 heads in supine 30sx2 each direction(6 sets total)    Joint Mobilization posterior and inferior humeral head mobilizations, 3x10, neutral distraction 3x10: added gentle c-spine mobs in lower spine to promote R rot including MWM into R rot and flex, 1x5 at c-6,5,4                    PT Short Term Goals - 09/11/20 1429      PT SHORT TERM GOAL #1   Title ALL STGS = LTGS             PT Long Term Goals - 09/11/20 1438      PT LONG TERM GOAL #1   Title Pt will be independent with final HEP in order to build upon functional gains made in therapy. ALL LTGS DUE 10/16/20    Time 5   due to  delay in scheduling   Period Weeks    Status New    Target Date 10/16/20      PT LONG TERM GOAL #2   Title DASH or NDI to be performed with goal written as appropriate.    Time 5    Period Weeks    Status New      PT LONG TERM GOAL #3   Title Pt will improve cervical rotation AROM to at least 55 degrees to the R and 45 degrees to the L in order to demo improved ROM for driving.    Baseline 42 deg to R, 22 deg to L    Time 5    Period Weeks    Status New      PT LONG TERM GOAL #4   Title Pt will improve R side bending AROM to at least 20 degrees with 2/10 or less pain.    Baseline 10 degrees with 5/10 pain on L side    Time 5    Period Weeks    Status New      PT LONG TERM GOAL #5   Title Pt will improve L shoulder flexion AROM to at least 115 degrees and L shoulder ABD AROM to at least 110 degrees in order to demo improved ROM for IADLS such as dressing.    Baseline L shoulder flexion 90 degrees with 5/10 pain, L  shoulder ABD 85 degrees with 7/10 pain    Time 5    Period Weeks    Status New                 Plan - 09/28/20 1238    Clinical Impression Statement paient presenting with increased shoulder and naeck mobility at start of session, 75% scaption noted on L, cervical rotation limited to 75% R, 50% L, continued but improving scapular mobility as well as improving cervical mobility    Personal Factors and Comorbidities Comorbidity 2    Comorbidities HTN, HLD    Examination-Activity Limitations Hygiene/Grooming;Dressing;Reach Overhead    Examination-Participation Restrictions Driving    Stability/Clinical Decision Making Evolving/Moderate complexity    Rehab Potential Good    PT Frequency 2x / week    PT Duration 4 weeks    PT Treatment/Interventions ADLs/Self Care Home Management;Functional mobility training;Therapeutic activities;Therapeutic exercise;Neuromuscular re-education;Balance training;Patient/family education;Manual techniques;Passive range of motion;Vestibular    PT Next Visit Plan perform DASH or NDI with goal written. f/u with manual technique to improve shoulder mobility and function, check pain levels    Consulted and Agree with Plan of Care Patient           Patient will benefit from skilled therapeutic intervention in order to improve the following deficits and impairments:  Decreased coordination,Decreased range of motion,Decreased strength,Impaired sensation,Impaired UE functional use,Postural dysfunction,Pain,Decreased mobility  Visit Diagnosis: Cervical spine arthritis     Problem List Patient Active Problem List   Diagnosis Date Noted  . Full incontinence of feces 07/16/2020  . C1-C4 level spinal cord injury ([place]) 07/16/2020  . Disease of spinal cord ([place]) 07/16/2020  . Decreased libido 05/18/2019  . Essential hypertension 04/16/2016  . COPD exacerbation ([place]) 04/16/2016  . Cervical spine arthritis 05/09/2013  . Sensory neuropathy 05/09/2013  . HLD  (hyperlipidemia) 05/09/2013    Lanice Shirts PT 09/28/2020, 1:50 PM  [facility] [address] [place], [place], [address] Phone: [phone]   Fax:  [phone]  Name: Benjamin Todd MRN: [id number] Date of Birth:  08/07/1951   

## 2020-10-01 ENCOUNTER — Other Ambulatory Visit: Payer: Self-pay

## 2020-10-01 ENCOUNTER — Ambulatory Visit: Payer: PPO

## 2020-10-01 DIAGNOSIS — M542 Cervicalgia: Secondary | ICD-10-CM

## 2020-10-01 DIAGNOSIS — M6281 Muscle weakness (generalized): Secondary | ICD-10-CM

## 2020-10-01 NOTE — Therapy (Signed)
[facility] [address] [place], [place], [address] Phone: [phone]   Fax:  [phone]  Physical Therapy Treatment  Patient Details  Name: Benjamin Todd MRN: [id number] Date of Birth: [birth year] Referring Provider (PT): Courtney Heys, MD   Encounter Date: 10/01/2020   PT End of Session - 10/01/20 1322    Visit Number 4    Number of Visits 9    Date for PT Re-Evaluation 12/10/20    Authorization Type [employer] P[employer]    PT Start Time 1230    PT Stop Time 1315    PT Time Calculation (min) 45 min    Activity Tolerance Patient tolerated treatment well    Behavior During Therapy [facility] for tasks assessed/performed           Past Medical History:  Diagnosis Date  . Decreased libido 05/18/2019  . Essential hypertension, benign   . Vitamin D deficiency disease     No past surgical history on file.  There were no vitals filed for this visit.   Subjective Assessment - 10/01/20 1250    Subjective 2-3 days increased soreness following last session but symptoms resolved to baseline of 1 week ago, continues to acknowledge improved shoulder function    Pertinent History PMH: HTN, HLD    Limitations Lifting    Diagnostic tests MRI 08/07/20: of cervical spine shows moderate ot severe neural foraminal narrowing at C5/6 and C6/7 and myelomalacia at C5/6 with T2 hyperintense signal at C5/6.  X-RAY of L shoulder 08/27/20: Negative for acute bony abnormality. Degenerative changes of the [facility] joint and [place] joint    Patient Stated Goals wants to stop the pain in his shoulder    Pain Onset More than a month ago                             [facility] Adult PT Treatment/Exercise - 10/01/20 0001      Shoulder Exercises: Supine   Shoulder Flexion Weight (lbs) 3x10, 0# with cane    Other Supine Exercises chest press, 0# 3x10      Shoulder Exercises: Sidelying   ABduction Weight (lbs) 3x10 in pen can position       Manual Therapy   Manual Therapy Joint mobilization;Soft tissue mobilization;Scapular mobilization    Manual therapy comments performed inf, post mobs and sustained glides into ER    Soft tissue mobilization performed forearm traction for 4-5 minute hold                    PT Short Term Goals - 09/11/20 1429      PT SHORT TERM GOAL #1   Title ALL STGS = LTGS             PT Long Term Goals - 09/11/20 1438      PT LONG TERM GOAL #1   Title Pt will be independent with final HEP in order to build upon functional gains made in therapy. ALL LTGS DUE 10/16/20    Time 5   due to delay in scheduling   Period Weeks    Status New    Target Date 10/16/20      PT LONG TERM GOAL #2   Title DASH or NDI to be performed with goal written as appropriate.    Time 5    Period Weeks    Status New      PT LONG TERM GOAL #  3   Title Pt will improve cervical rotation AROM to at least 55 degrees to the R and 45 degrees to the L in order to demo improved ROM for driving.    Baseline 42 deg to R, 22 deg to L    Time 5    Period Weeks    Status New      PT LONG TERM GOAL #4   Title Pt will improve R side bending AROM to at least 20 degrees with 2/10 or less pain.    Baseline 10 degrees with 5/10 pain on L side    Time 5    Period Weeks    Status New      PT LONG TERM GOAL #5   Title Pt will improve L shoulder flexion AROM to at least 115 degrees and L shoulder ABD AROM to at least 110 degrees in order to demo improved ROM for IADLS such as dressing.    Baseline L shoulder flexion 90 degrees with 5/10 pain, L shoulder ABD 85 degrees with 7/10 pain    Time 5    Period Weeks    Status New                 Plan - 10/01/20 1302    Clinical Impression Statement initial c/o increased soreness following last session but resolved to baseline as of 1 week ago, focused on AAROM and AROM in an effort to decrease guarding and promote proper shoulder mechanics, withheld c-spine mobs  due to increased soreness follwing last session    Personal Factors and Comorbidities Comorbidity 2    Comorbidities HTN, HLD    Examination-Activity Limitations Hygiene/Grooming;Dressing;Reach Overhead    Examination-Participation Restrictions Driving    Stability/Clinical Decision Making Evolving/Moderate complexity    Rehab Potential Good    PT Frequency 2x / week    PT Duration 4 weeks    PT Treatment/Interventions ADLs/Self Care Home Management;Functional mobility training;Therapeutic activities;Therapeutic exercise;Neuromuscular re-education;Balance training;Patient/family education;Manual techniques;Passive range of motion;Vestibular    PT Next Visit Plan check C-ROM, shoulder ROM and scapular mobility in standing and compare B, continue soft tissue mob as neeeded in c-spine and L shoulder complex    Consulted and Agree with Plan of Care Patient           Patient will benefit from skilled therapeutic intervention in order to improve the following deficits and impairments:  Decreased coordination,Decreased range of motion,Decreased strength,Impaired sensation,Impaired UE functional use,Postural dysfunction,Pain,Decreased mobility  Visit Diagnosis: Cervicalgia  Muscle weakness (generalized)     Problem List Patient Active Problem List   Diagnosis Date Noted  . Full incontinence of feces 07/16/2020  . 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 Disease of spinal cord ([place]) 07/16/2020  . Decreased libido 05/18/2019  . Essential hypertension 04/16/2016  . COPD exacerbation ([place]) 04/16/2016  . Cervical spine arthritis 05/09/2013  . Sensory neuropathy 05/09/2013  . HLD (hyperlipidemia) 05/09/2013    Lanice Shirts PT 10/01/2020, 1:55 PM  [facility] [address] [place], [place], [address] Phone: [phone]   Fax:  [phone]  Name: HALDON CARLEY MRN: [id number] Date of Birth: [birth year]

## 2020-10-02 ENCOUNTER — Encounter (INDEPENDENT_AMBULATORY_CARE_PROVIDER_SITE_OTHER): Payer: PPO | Admitting: Internal Medicine

## 2020-10-04 ENCOUNTER — Ambulatory Visit: Payer: PPO

## 2020-10-04 ENCOUNTER — Other Ambulatory Visit: Payer: Self-pay

## 2020-10-04 ENCOUNTER — Ambulatory Visit (INDEPENDENT_AMBULATORY_CARE_PROVIDER_SITE_OTHER): Payer: PPO | Admitting: Internal Medicine

## 2020-10-04 ENCOUNTER — Encounter (INDEPENDENT_AMBULATORY_CARE_PROVIDER_SITE_OTHER): Payer: Self-pay | Admitting: Internal Medicine

## 2020-10-04 VITALS — BP 140/80 | HR 72 | Ht 69.0 in | Wt 176.2 lb

## 2020-10-04 DIAGNOSIS — M6281 Muscle weakness (generalized): Secondary | ICD-10-CM

## 2020-10-04 DIAGNOSIS — M542 Cervicalgia: Secondary | ICD-10-CM

## 2020-10-04 DIAGNOSIS — Z125 Encounter for screening for malignant neoplasm of prostate: Secondary | ICD-10-CM

## 2020-10-04 DIAGNOSIS — R6882 Decreased libido: Secondary | ICD-10-CM | POA: Diagnosis not present

## 2020-10-04 DIAGNOSIS — E782 Mixed hyperlipidemia: Secondary | ICD-10-CM

## 2020-10-04 DIAGNOSIS — I10 Essential (primary) hypertension: Secondary | ICD-10-CM | POA: Diagnosis not present

## 2020-10-04 DIAGNOSIS — E559 Vitamin D deficiency, unspecified: Secondary | ICD-10-CM

## 2020-10-04 NOTE — Patient Instructions (Signed)
Access Code: [email or web address] URL: [email or web address] Date: 10/04/2020 Prepared by: Sharlynn Oliphant  Program Notes use yellow band   Exercises Standing Bilateral Low Shoulder Row with Anchored Resistance - 3 x daily - 5 x weekly - 3 sets - 10 reps Seated Upper Trapezius Stretch - 3 x daily - 5 x weekly - 3 sets - 10 reps

## 2020-10-04 NOTE — Therapy (Signed)
[facility] [address] [place], [place], [address] Phone: [phone]   Fax:  [phone]  Physical Therapy Treatment  Patient Details  Name: Benjamin Todd MRN: [id number] Date of Birth: [birth year] Referring Provider (PT): Courtney Heys, MD   Encounter Date: 10/04/2020   PT End of Session - 10/04/20 1350    Visit Number 5    Number of Visits 9    Date for PT Re-Evaluation 12/10/20    Authorization Type [employer] P[employer]    PT Start Time 1962    PT Stop Time 1230    PT Time Calculation (min) 45 min    Activity Tolerance Patient tolerated treatment well    Behavior During Therapy [facility] for tasks assessed/performed           Past Medical History:  Diagnosis Date  . Decreased libido 05/18/2019  . Essential hypertension, benign   . Vitamin D deficiency disease     History reviewed. No pertinent surgical history.  There were no vitals filed for this visit.   Subjective Assessment - 10/04/20 1158    Subjective Much less discomfort following last session, some increase in neck pain following excessive cervical extension to to apply eyedrops    Pertinent History PMH: HTN, HLD    Limitations Lifting    Diagnostic tests MRI 08/07/20: of cervical spine shows moderate ot severe neural foraminal narrowing at C5/6 and C6/7 and myelomalacia at C5/6 with T2 hyperintense signal at C5/6.  X-RAY of L shoulder 08/27/20: Negative for acute bony abnormality. Degenerative changes of the [facility] joint and [place] joint    Patient Stated Goals wants to stop the pain in his shoulder    Currently in Pain? Yes    Pain Score 3     Pain Location Shoulder    Pain Orientation Left    Pain Descriptors / Indicators Aching    Pain Type Chronic pain    Pain Onset More than a month ago                             [facility] Adult PT Treatment/Exercise - 10/04/20 0001      Shoulder Exercises: Supine   Shoulder Flexion Weight  (lbs) supine 3x10 with wand    Other Supine Exercises chest press with protraction 3x10      Shoulder Exercises: Sidelying   ABduction Weight (lbs) 3x10 in ER with manual facilitation of scapula for proper rotation      Manual Therapy   Manual therapy comments maual facilitation of scapula for prope rotation and mechanics, assessed first rib mobility B and found restrictions on L, manual upper trap stertch for 30s x3                  PT Education - 10/04/20 1349    Education Details see above [place] program    Person(s) Educated Patient    Methods Explanation;Demonstration;Handout    Comprehension Verbalized understanding;Returned demonstration            PT Short Term Goals - 09/11/20 1429      PT SHORT TERM GOAL #1   Title ALL STGS = LTGS             PT Long Term Goals - 09/11/20 1438      PT LONG TERM GOAL #1   Title Pt will be independent with final HEP in order to build upon functional gains made in  therapy. ALL LTGS DUE 10/16/20    Time 5   due to delay in scheduling   Period Weeks    Status New    Target Date 10/16/20      PT LONG TERM GOAL #2   Title DASH or NDI to be performed with goal written as appropriate.    Time 5    Period Weeks    Status New      PT LONG TERM GOAL #3   Title Pt will improve cervical rotation AROM to at least 55 degrees to the R and 45 degrees to the L in order to demo improved ROM for driving.    Baseline 42 deg to R, 22 deg to L    Time 5    Period Weeks    Status New      PT LONG TERM GOAL #4   Title Pt will improve R side bending AROM to at least 20 degrees with 2/10 or less pain.    Baseline 10 degrees with 5/10 pain on L side    Time 5    Period Weeks    Status New      PT LONG TERM GOAL #5   Title Pt will improve L shoulder flexion AROM to at least 115 degrees and L shoulder ABD AROM to at least 110 degrees in order to demo improved ROM for IADLS such as dressing.    Baseline L shoulder flexion 90 degrees  with 5/10 pain, L shoulder ABD 85 degrees with 7/10 pain    Time 5    Period Weeks    Status New                 Plan - 10/04/20 1351    Clinical Impression Statement despite improvement in cervical and L shoulder ROM, little carryover today at start of session, AROM increased following session which included AAROM encouraging scapular mobility with PT facilitation, AROM much improved at end of session with continued limitations in scapular mechanic and scapulohumeral rhythm    Personal Factors and Comorbidities Comorbidity 2    Comorbidities HTN, HLD    Examination-Activity Limitations Hygiene/Grooming;Dressing;Reach Overhead    Examination-Participation Restrictions Driving    Stability/Clinical Decision Making Evolving/Moderate complexity    Rehab Potential Good    PT Frequency 2x / week    PT Duration 4 weeks    PT Treatment/Interventions ADLs/Self Care Home Management;Functional mobility training;Therapeutic activities;Therapeutic exercise;Neuromuscular re-education;Balance training;Patient/family education;Manual techniques;Passive range of motion;Vestibular    PT Next Visit Plan f/u on new exercises, address scapular resrictions/mechanics and monitor anterior shoulder translation, 1st rib mob?, posterior shoulder strengthening    PT Home Exercise Plan upper trap stetch and rowing with yellow t-band    Consulted and Agree with Plan of Care Patient           Patient will benefit from skilled therapeutic intervention in order to improve the following deficits and impairments:  Decreased coordination,Decreased range of motion,Decreased strength,Impaired sensation,Impaired UE functional use,Postural dysfunction,Pain,Decreased mobility  Visit Diagnosis: Cervicalgia  Muscle weakness (generalized)     Problem List Patient Active Problem List   Diagnosis Date Noted  . Full incontinence of feces 07/16/2020  . 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 Disease of  spinal cord ([place]) 07/16/2020  . Decreased libido 05/18/2019  . Essential hypertension 04/16/2016  . COPD exacerbation ([place]) 04/16/2016  . Cervical spine arthritis 05/09/2013  . Sensory neuropathy 05/09/2013  . HLD (hyperlipidemia) 05/09/2013    Jacqulynn Cadet  Radiance Deady PT 10/04/2020, 2:06 PM  [facility] [address] [place], [place], [address] Phone: [phone]   Fax:  [phone]  Name: Benjamin Todd MRN: [id number] Date of Birth: [birth year]

## 2020-10-04 NOTE — Progress Notes (Signed)
Metrics: Intervention Frequency ACO  Documented Smoking Status Yearly  Screened one or more times in 24 months  Cessation Counseling or  Active cessation medication Past 24 months  Past 24 months   Guideline developer: UpToDate (See UpToDate for funding source) Date Released: 2014       Wellness Office Visit  Subjective:  Patient ID: Benjamin Todd, male    DOB: [birth year]  Age: 70 y.o. MRN: [id number]  CC: This man comes in for follow-up of hypertension, dyslipidemia, vitamin D deficiency and also follow-up of testosterone therapy. HPI  He has been undergoing a lot of physical therapy and is under the care of physical medicine rehab.  He does appear to have cervical spine issues and may well be referred to neurosurgery but he will follow-up on this.  He continues on vitamin D3 10,000 units daily. He continues on testosterone therapy as before. Past Medical History:  Diagnosis Date  . Decreased libido 05/18/2019  . Essential hypertension, benign   . Vitamin D deficiency disease    History reviewed. No pertinent surgical history.   Family History  Problem Relation Age of Onset  . Cancer Mother        breast  . 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   Social History   Tobacco Use  . Smoking status: Former Smoker    Types: Cigars    Quit date: 08/11/2017    Years since quitting: 3.1  . Smokeless tobacco: Never Used  Substance Use Topics  . Alcohol use: Not Currently    Current Meds  Medication Sig  . Cholecalciferol (VITAMIN D3) 125 MCG (5000 UT) TABS Take 2 tablets by mouth daily.  Marland Kitchen testosterone cypionate (DEPOTESTOSTERONE CYPIONATE) 200 MG/ML injection INJECT 0.5ML INTRAMUSCULARLY ONCE A WEEK     Flowsheet Row Office Visit from 07/16/2020 in [facility]  PHQ-9 Total Score 8      Objective:   Today's Vitals: BP  140/80   Pulse 72   Ht 5\' 9"  (1.753 m)   Wt 176 lb 3.2 oz (79.9 kg)   BMI 26.02 kg/m  Vitals with BMI 10/04/2020 08/27/2020 07/16/2020  Height 5\' 9"  5\' 9"  -  Weight 176 lbs 3 oz 178 lbs -  BMI 51.02 58.52 -  Systolic 778 242 353  Diastolic 80 71 90  Pulse 72 76 95     Physical Exam  He looks systemically well.  Blood pressure is acceptable for his age.  He is alert and orientated without any obvious focal neurological signs at the present time.     Assessment   1. Essential hypertension   2. Mixed hyperlipidemia   3. Vitamin D deficiency disease   4. Decreased libido   5. Special screening for malignant neoplasm of prostate       Tests ordered Orders Placed This Encounter  Procedures  . COMPLETE METABOLIC PANEL WITH GFR  . Lipid panel  . PSA, Total with Reflex to PSA, Free     Plan: 1. Blood work is ordered. 2. He will continue with all medications above. 3. Follow-up as scheduled in August with Sarah   No orders of the defined types were placed in this encounter.   Doree Albee, MD

## 2020-10-05 DIAGNOSIS — H52222 Regular astigmatism, left eye: Secondary | ICD-10-CM | POA: Diagnosis not present

## 2020-10-05 DIAGNOSIS — H25812 Combined forms of age-related cataract, left eye: Secondary | ICD-10-CM | POA: Diagnosis not present

## 2020-10-08 ENCOUNTER — Other Ambulatory Visit: Payer: Self-pay

## 2020-10-08 ENCOUNTER — Other Ambulatory Visit (INDEPENDENT_AMBULATORY_CARE_PROVIDER_SITE_OTHER): Payer: Self-pay | Admitting: Internal Medicine

## 2020-10-08 ENCOUNTER — Ambulatory Visit: Payer: PPO

## 2020-10-08 DIAGNOSIS — M6281 Muscle weakness (generalized): Secondary | ICD-10-CM

## 2020-10-08 DIAGNOSIS — M542 Cervicalgia: Secondary | ICD-10-CM

## 2020-10-08 NOTE — Patient Instructions (Addendum)
  Access Code: [email or web address] URL: [email or web address] Date: 10/08/2020 Prepared by: Sharlynn Oliphant  Exercises Standing Row with Resistance with Anchored Resistance at Chest Height Palms Down - 1 x daily - 5 x weekly - 3 sets - 10 reps Doorway Pec Stretch at 90 Degrees Abduction - 1 x daily - 5 x weekly - 1 sets - 30 hold Doorway Pec Stretch at 60 Elevation - 1 x daily - 5 x weekly - 1 sets - 30 hold Doorway Pec Stretch at 120 Degrees Abduction - 1 x daily - 5 x weekly - 1 sets - 30 hold

## 2020-10-08 NOTE — Therapy (Signed)
[facility] [address] [place], [place], [address] Phone: [phone]   Fax:  [phone]  Physical Therapy Treatment  Patient Details  Name: Benjamin Todd MRN: [id number] Date of Birth: 1950-09-17 Referring Provider (PT): Courtney Heys, MD   Encounter Date: 10/08/2020   PT End of Session - 10/08/20 1249    Visit Number 6    Number of Visits 9    Date for PT Re-Evaluation 12/10/20    Authorization Type [employer]    PT Start Time 1230    PT Stop Time 1315    PT Time Calculation (min) 45 min    Equipment Utilized During Treatment Gait belt    Activity Tolerance Patient tolerated treatment well    Behavior During Therapy [facility] for tasks assessed/performed           Past Medical History:  Diagnosis Date  . Decreased libido 05/18/2019  . Essential hypertension, benign   . Vitamin D deficiency disease     History reviewed. No pertinent surgical history.  There were no vitals filed for this visit.   Subjective Assessment - 10/08/20 1237    Subjective L shoulder much improved following last session and trap stretching, underwent eye surgery 2/18 and positioning for surgery aggravated L shouder symptoms needed 48 hrs to resolve    Pertinent History PMH: HTN, HLD    Limitations Lifting    Diagnostic tests MRI 08/07/20: of cervical spine shows moderate ot severe neural foraminal narrowing at C5/6 and C6/7 and myelomalacia at C5/6 with T2 hyperintense signal at C5/6.  X-RAY of L shoulder 08/27/20: Negative for acute bony abnormality. Degenerative changes of the [facility] joint and [place] joint    Patient Stated Goals wants to stop the pain in his shoulder    Pain Onset More than a month ago                             [facility] Adult PT Treatment/Exercise - 10/08/20 0001      Shoulder Exercises: Supine   Shoulder Flexion Weight (lbs) 2x15 in supine with wand/ PVC square    Other Supine Exercises  chest press with protraction 2x15 with wand/PVC square      Shoulder Exercises: Sidelying   ABduction Weight (lbs) 2x15 with TCs to facilitate shoulder depression      Shoulder Exercises: Stretch   External Rotation Stretch 3 reps;30 seconds   with wand in supine   Other Shoulder Stretches doorway stretch at high/mid/low positions, 30s hold each osition    External Rotation Stretch Limitations demoed doorway stretch at 3 positions, 30s x1 ea. position      Manual Therapy   Joint Mobilization posterio capsule stretch in neutral and 90d flexion, 3x10 ea. position                  PT Education - 10/08/20 1248    Education Details see above medbridge program    Person(s) Educated Patient    Methods Explanation;Demonstration;Handout    Comprehension Verbalized understanding;Returned demonstration            PT Short Term Goals - 09/11/20 1429      PT SHORT TERM GOAL #1   Title ALL STGS = LTGS             PT Long Term Goals - 09/11/20 1438      PT LONG TERM GOAL #1   Title Pt  will be independent with final HEP in order to build upon functional gains made in therapy. ALL LTGS DUE 10/16/20    Time 5   due to delay in scheduling   Period Weeks    Status New    Target Date 10/16/20      PT LONG TERM GOAL #2   Title DASH or NDI to be performed with goal written as appropriate.    Time 5    Period Weeks    Status New      PT LONG TERM GOAL #3   Title Pt will improve cervical rotation AROM to at least 55 degrees to the R and 45 degrees to the L in order to demo improved ROM for driving.    Baseline 42 deg to R, 22 deg to L    Time 5    Period Weeks    Status New      PT LONG TERM GOAL #4   Title Pt will improve R side bending AROM to at least 20 degrees with 2/10 or less pain.    Baseline 10 degrees with 5/10 pain on L side    Time 5    Period Weeks    Status New      PT LONG TERM GOAL #5   Title Pt will improve L shoulder flexion AROM to at least 115 degrees  and L shoulder ABD AROM to at least 110 degrees in order to demo improved ROM for IADLS such as dressing.    Baseline L shoulder flexion 90 degrees with 5/10 pain, L shoulder ABD 85 degrees with 7/10 pain    Time 5    Period Weeks    Status New                 Plan - 10/08/20 1409    Clinical Impression Statement reported relief following last session but had eye surgery on 2/18 and position on table fpr prolonged period caused an increas in shoulder symptoms, discomfort lasted 48 hrs and did not allow for perfomance of HEP, was able to perform rows this AM w/o problem.  Continues to demonstarte tight traps/scalenes resulting in faulty scapular mechanics, PROM has been restored but continued need of trap/scalene stretches and as well as posterior shoulder strengthening is needed    Personal Factors and Comorbidities Comorbidity 2    Comorbidities HTN, HLD    Examination-Activity Limitations Hygiene/Grooming;Dressing;Reach Overhead    Examination-Participation Restrictions Driving    Stability/Clinical Decision Making Evolving/Moderate complexity    Rehab Potential Good    PT Frequency 2x / week    PT Duration 4 weeks    PT Treatment/Interventions ADLs/Self Care Home Management;Functional mobility training;Therapeutic activities;Therapeutic exercise;Neuromuscular re-education;Balance training;Patient/family education;Manual techniques;Passive range of motion;Vestibular    PT Next Visit Plan f/u on new exercises and stretches, add resisted shoulder depression and multiposition rowing and facilitation of scapular depression    PT Home Exercise Plan upper trap stetch and rowing with yellow t-band    Consulted and Agree with Plan of Care Patient           Patient will benefit from skilled therapeutic intervention in order to improve the following deficits and impairments:  Decreased coordination,Decreased range of motion,Decreased strength,Impaired sensation,Impaired UE functional  use,Postural dysfunction,Pain,Decreased mobility  Visit Diagnosis: Cervicalgia  Muscle weakness (generalized)     Problem List Patient Active Problem List   Diagnosis Date Noted  . Full incontinence of feces 07/16/2020  . C1-C4 level spinal cord injury ([place])  07/16/2020  . Disease of spinal cord ([place]) 07/16/2020  . Decreased libido 05/18/2019  . Essential hypertension 04/16/2016  . COPD exacerbation ([place]) 04/16/2016  . Cervical spine arthritis 05/09/2013  . Sensory neuropathy 05/09/2013  . HLD (hyperlipidemia) 05/09/2013    Lanice Shirts  PT 10/08/2020, 2:17 PM  [place] [address] [place], [place], [address] Phone: [phone]   Fax:  [phone]  Name: Benjamin Todd MRN: [id number] Date of Birth: [birth year]

## 2020-10-11 ENCOUNTER — Other Ambulatory Visit: Payer: Self-pay

## 2020-10-11 ENCOUNTER — Ambulatory Visit: Payer: PPO

## 2020-10-11 ENCOUNTER — Other Ambulatory Visit (INDEPENDENT_AMBULATORY_CARE_PROVIDER_SITE_OTHER): Payer: PPO

## 2020-10-11 DIAGNOSIS — I10 Essential (primary) hypertension: Secondary | ICD-10-CM | POA: Diagnosis not present

## 2020-10-11 DIAGNOSIS — M6281 Muscle weakness (generalized): Secondary | ICD-10-CM | POA: Diagnosis not present

## 2020-10-11 DIAGNOSIS — Z125 Encounter for screening for malignant neoplasm of prostate: Secondary | ICD-10-CM | POA: Diagnosis not present

## 2020-10-11 DIAGNOSIS — R2681 Unsteadiness on feet: Secondary | ICD-10-CM

## 2020-10-11 DIAGNOSIS — E782 Mixed hyperlipidemia: Secondary | ICD-10-CM | POA: Diagnosis not present

## 2020-10-11 NOTE — Patient Instructions (Signed)
Instructed in L levator stretch 30sx3, scpular depression, wall pushups, ER at 0d abd and shoulder extension using yellow band 3x10 reps

## 2020-10-11 NOTE — Therapy (Signed)
[place] [address] [place], [place], [address] Phone: [phone]   Fax:  [phone]  Physical Therapy Treatment  Patient Details  Name: Benjamin Todd MRN: [id number] Date of Birth: [birth year] Referring Provider (PT): Courtney Heys, MD   Encounter Date: 10/11/2020   PT End of Session - 10/11/20 1429    Visit Number 7    Number of Visits 9    Date for PT Re-Evaluation 12/10/20    Authorization Type [employer] P[employer]    PT Start Time 1230    PT Stop Time 1315    PT Time Calculation (min) 45 min    Equipment Utilized During Treatment Gait belt    Activity Tolerance Patient tolerated treatment well    Behavior During Therapy [facility] for tasks assessed/performed           Past Medical History:  Diagnosis Date  . Decreased libido 05/18/2019  . Essential hypertension, benign   . Vitamin D deficiency disease     No past surgical history on file.  There were no vitals filed for this visit.   Subjective Assessment - 10/11/20 1416    Subjective still reporting neck tightness, has been having trouble sleeping as he has to wear and eye patch following cataract sx and is sleeping on L shoulder and aggravating condition, overall he feels he is doing more at home with his L arm w/o any increased intensity of discomfort, slowly improving    Pertinent History PMH: HTN, HLD    Limitations Lifting    Diagnostic tests MRI 08/07/20: of cervical spine shows moderate ot severe neural foraminal narrowing at C5/6 and C6/7 and myelomalacia at C5/6 with T2 hyperintense signal at C5/6.  X-RAY of L shoulder 08/27/20: Negative for acute bony abnormality. Degenerative changes of the [facility] joint and [place] joint    Patient Stated Goals wants to stop the pain in his shoulder    Pain Onset More than a month ago                             [facility] Adult PT Treatment/Exercise - 10/11/20 0001      Knee/Hip Exercises:  Aerobic   Nustep 8' @L2 , focus on UEs as tolerated      Shoulder Exercises: Seated   Other Seated Exercises shoulder depression B, pushing into plyoballs, 3x10      Shoulder Exercises: Standing   External Rotation Strengthening;Both;10 reps;Theraband;Limitations    Theraband Level (Shoulder External Rotation) Level 1 (Yellow)    External Rotation Limitations performed at 0d abd, 3x10    Extension Strengthening;10 reps;Theraband;Limitations    Theraband Level (Shoulder Extension) Level 1 (Yellow)    Extension Limitations straight elbow, ROM from flexion to neutral to prevent anterior shoulder pain    Other Standing Exercises wall pushup, 2x15                  PT Education - 10/11/20 1421    Education Details see above    Person(s) Educated Patient    Methods Explanation;Demonstration;Handout    Comprehension Verbalized understanding;Returned demonstration            PT Short Term Goals - 09/11/20 1429      PT SHORT TERM GOAL #1   Title ALL STGS = LTGS             PT Long Term Goals - 09/11/20 1438      PT LONG  TERM GOAL #1   Title Pt will be independent with final HEP in order to build upon functional gains made in therapy. ALL LTGS DUE 10/16/20    Time 5   due to delay in scheduling   Period Weeks    Status New    Target Date 10/16/20      PT LONG TERM GOAL #2   Title DASH or NDI to be performed with goal written as appropriate.    Time 5    Period Weeks    Status New      PT LONG TERM GOAL #3   Title Pt will improve cervical rotation AROM to at least 55 degrees to the R and 45 degrees to the L in order to demo improved ROM for driving.    Baseline 42 deg to R, 22 deg to L    Time 5    Period Weeks    Status New      PT LONG TERM GOAL #4   Title Pt will improve R side bending AROM to at least 20 degrees with 2/10 or less pain.    Baseline 10 degrees with 5/10 pain on L side    Time 5    Period Weeks    Status New      PT LONG TERM GOAL #5    Title Pt will improve L shoulder flexion AROM to at least 115 degrees and L shoulder ABD AROM to at least 110 degrees in order to demo improved ROM for IADLS such as dressing.    Baseline L shoulder flexion 90 degrees with 5/10 pain, L shoulder ABD 85 degrees with 7/10 pain    Time 5    Period Weeks    Status New                 Plan - 10/11/20 1429    Clinical Impression Statement slow improvement in shoulder pain and function, still hindered by restrictions and sequelae from recent cataract surgery, able to tolerate more resistive strengthening this date, focus of skilled session was posterior L shoulder strengthening and restoring scapular mechanics ant anterior shoulder posturing    Personal Factors and Comorbidities Comorbidity 2    Comorbidities HTN, HLD    Stability/Clinical Decision Making Evolving/Moderate complexity    Rehab Potential Good    PT Frequency 2x / week    PT Duration 4 weeks    PT Treatment/Interventions ADLs/Self Care Home Management;Functional mobility training;Therapeutic activities;Therapeutic exercise;Neuromuscular re-education;Balance training;Patient/family education;Manual techniques;Passive range of motion;Vestibular    PT Next Visit Plan review HEP and new exercises, f/u on anterior shoulder posturing and remind not to overstretch    PT Home Exercise Plan documented on elsewhere           Patient will benefit from skilled therapeutic intervention in order to improve the following deficits and impairments:  Decreased coordination,Decreased range of motion,Decreased strength,Impaired sensation,Impaired UE functional use,Postural dysfunction,Pain,Decreased mobility  Visit Diagnosis: Unsteadiness on feet  Muscle weakness (generalized)     Problem List Patient Active Problem List   Diagnosis Date Noted  . Full incontinence of feces 07/16/2020  . 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 Disease of spinal cord ([place]) 07/16/2020  .  Decreased libido 05/18/2019  . Essential hypertension 04/16/2016  . COPD exacerbation ([place]) 04/16/2016  . Cervical spine arthritis 05/09/2013  . Sensory neuropathy 05/09/2013  . HLD (hyperlipidemia) 05/09/2013    Lanice Shirts PT 10/11/2020, 2:41 PM  [place]  [place], [place], [address] Phone: [phone]   Fax:  [phone]  Name: DANZIG MACGREGOR MRN: [id number] Date of Birth: [birth year]

## 2020-10-12 LAB — COMPLETE METABOLIC PANEL WITH GFR
AG Ratio: 1.8 (calc) (ref 1.0–2.5)
ALT: 16 U/L (ref 9–46)
AST: 12 U/L (ref 10–35)
Albumin: 4 g/dL (ref 3.6–5.1)
Alkaline phosphatase (APISO): 44 U/L (ref 35–144)
BUN: 17 mg/dL (ref 7–25)
CO2: 30 mmol/L (ref 20–32)
Calcium: 9.2 mg/dL (ref 8.6–10.3)
Chloride: 107 mmol/L (ref 98–110)
Creat: 1.16 mg/dL (ref 0.70–1.18)
GFR, Est African American: 73 mL/min/{1.73_m2} (ref >=60)
GFR, Est Non African American: 63 mL/min/{1.73_m2} (ref >=60)
Globulin: 2.2 g/dL (calc) (ref 1.9–3.7)
Glucose, Bld: 93 mg/dL (ref 65–99)
Potassium: 3.9 mmol/L (ref 3.5–5.3)
Sodium: 142 mmol/L (ref 135–146)
Total Bilirubin: 0.5 mg/dL (ref 0.2–1.2)
Total Protein: 6.2 g/dL (ref 6.1–8.1)

## 2020-10-12 LAB — LIPID PANEL
Cholesterol: 172 mg/dL (ref <200)
HDL: 43 mg/dL (ref >=40)
LDL Cholesterol (Calc): 110 mg/dL (calc) — ABNORMAL HIGH
Non-HDL Cholesterol (Calc): 129 mg/dL (calc) (ref <130)
Total CHOL/HDL Ratio: 4 (calc) (ref <5.0)
Triglycerides: 95 mg/dL (ref <150)

## 2020-10-12 LAB — PSA, TOTAL WITH REFLEX TO PSA, FREE: PSA, Total: 0.8 ng/mL (ref <=4.0)

## 2020-10-15 ENCOUNTER — Other Ambulatory Visit: Payer: Self-pay

## 2020-10-15 ENCOUNTER — Ambulatory Visit: Payer: PPO

## 2020-10-15 DIAGNOSIS — R2681 Unsteadiness on feet: Secondary | ICD-10-CM

## 2020-10-15 DIAGNOSIS — M6281 Muscle weakness (generalized): Secondary | ICD-10-CM | POA: Diagnosis not present

## 2020-10-15 DIAGNOSIS — M542 Cervicalgia: Secondary | ICD-10-CM

## 2020-10-15 NOTE — Therapy (Signed)
[facility] [address] [place], [place], [address] Phone: [phone]   Fax:  [phone]  Physical Therapy Treatment  Patient Details  Name: Benjamin Todd MRN: [id number] Date of Birth: 01-04-1951 Referring Provider (PT): Courtney Heys, MD   Encounter Date: 10/15/2020   PT End of Session - 10/15/20 1430    Visit Number 8    Number of Visits 9    Date for PT Re-Evaluation 12/10/20    Authorization Type [employer] PPO    PT Start Time 1230    PT Stop Time 1315    PT Time Calculation (min) 45 min    Equipment Utilized During Treatment Gait belt    Activity Tolerance Patient tolerated treatment well    Behavior During Therapy [facility] for tasks assessed/performed           Past Medical History:  Diagnosis Date  . Decreased libido 05/18/2019  . Essential hypertension, benign   . Vitamin D deficiency disease     No past surgical history on file.  There were no vitals filed for this visit.   Subjective Assessment - 10/15/20 1241    Subjective [place] pain less frequent, reports only pain with sleep positions and sitting still watching TV, has been compliant with HEP which does not aggravate shoulder    Pertinent History PMH: HTN, HLD    Limitations Lifting    How long can you sit comfortably? 30    Diagnostic tests MRI 08/07/20: of cervical spine shows moderate ot severe neural foraminal narrowing at C5/6 and C6/7 and myelomalacia at C5/6 with T2 hyperintense signal at C5/6.  X-RAY of L shoulder 08/27/20: Negative for acute bony abnormality. Degenerative changes of the [facility] joint and [place] joint    Patient Stated Goals wants to stop the pain in his shoulder    Currently in Pain? No/denies    Pain Onset More than a month ago                             [facility] Adult PT Treatment/Exercise - 10/15/20 0001      Knee/Hip Exercises: Aerobic   Nustep 8' L2 arms 12      Shoulder Exercises:  Supine   Protraction Strengthening;Left;15 reps;Weights;Limitations    Protraction Weight (lbs) 1.1# ball    Protraction Limitations 2x15 press then protract    Flexion Strengthening;Left;15 reps;Weights;Limitations    Shoulder Flexion Weight (lbs) 1.1# ball    Flexion Limitations 2x15    Other Supine Exercises chest press combined with protraction      Shoulder Exercises: Prone   Extension Strengthening;Left;15 reps    Extension Weight (lbs) 1.1# ball    Horizontal ABduction 1 Strengthening;Left;15 reps;Weights;Limitations    Horizontal ABduction 1 Weight (lbs) 1.1# ball    Horizontal ABduction 1 Limitations 2x15 through availble ROM    Other Prone Exercises prone scaption with 1.1# ball, 2x15    Other Prone Exercises --      Shoulder Exercises: Sidelying   External Rotation Strengthening;15 reps    External Rotation Weight (lbs) 1.1# ball    External Rotation Limitations sidelie in 0d abd to focus on subscapularis stetch                  PT Education - 10/15/20 1429    Education Details Use lumbar support when sitting    Person(s) Educated Patient    Methods Explanation;Demonstration    Comprehension Verbalized  understanding;Returned demonstration            PT Short Term Goals - 09/11/20 1429      PT SHORT TERM GOAL #1   Title ALL STGS = LTGS             PT Long Term Goals - 09/11/20 1438      PT LONG TERM GOAL #1   Title Pt will be independent with final HEP in order to build upon functional gains made in therapy. ALL LTGS DUE 10/16/20    Time 5   due to delay in scheduling   Period Weeks    Status New    Target Date 10/16/20      PT LONG TERM GOAL #2   Title DASH or NDI to be performed with goal written as appropriate.    Time 5    Period Weeks    Status New      PT LONG TERM GOAL #3   Title Pt will improve cervical rotation AROM to at least 55 degrees to the R and 45 degrees to the L in order to demo improved ROM for driving.    Baseline 42  deg to R, 22 deg to L    Time 5    Period Weeks    Status New      PT LONG TERM GOAL #4   Title Pt will improve R side bending AROM to at least 20 degrees with 2/10 or less pain.    Baseline 10 degrees with 5/10 pain on L side    Time 5    Period Weeks    Status New      PT LONG TERM GOAL #5   Title Pt will improve L shoulder flexion AROM to at least 115 degrees and L shoulder ABD AROM to at least 110 degrees in order to demo improved ROM for IADLS such as dressing.    Baseline L shoulder flexion 90 degrees with 5/10 pain, L shoulder ABD 85 degrees with 7/10 pain    Time 5    Period Weeks    Status New                 Plan - 10/15/20 1431    Clinical Impression Statement focus of todays skilled session was posterior shoulder and RC strengthening to resolve anterior L shoulder posturing and posterior shoulder weakness creating impingement moment, AROM much improved, symptom onset with eccentric movements only suggesting tendonitis possibly.    Personal Factors and Comorbidities Comorbidity 2    Comorbidities HTN, HLD    Examination-Participation Restrictions Driving    Stability/Clinical Decision Making Evolving/Moderate complexity    Rehab Potential Good    PT Frequency 2x / week    PT Duration 4 weeks    PT Treatment/Interventions ADLs/Self Care Home Management;Functional mobility training;Therapeutic activities;Therapeutic exercise;Neuromuscular re-education;Balance training;Patient/family education;Manual techniques;Passive range of motion;Vestibular    PT Next Visit Plan f/u on new exercises and effect on symptoms, review STGs, extend services as needed    PT Home Exercise Plan Use lumbar support when sitting           Patient will benefit from skilled therapeutic intervention in order to improve the following deficits and impairments:  Decreased coordination,Decreased range of motion,Decreased strength,Impaired sensation,Impaired UE functional use,Postural  dysfunction,Pain,Decreased mobility  Visit Diagnosis: Unsteadiness on feet  Cervicalgia     Problem List Patient Active Problem List   Diagnosis Date Noted  . Full incontinence of feces 07/16/2020  .  C1-C4 level spinal cord injury ([place]) 07/16/2020  . Disease of spinal cord ([place]) 07/16/2020  . Decreased libido 05/18/2019  . Essential hypertension 04/16/2016  . COPD exacerbation ([place]) 04/16/2016  . Cervical spine arthritis 05/09/2013  . Sensory neuropathy 05/09/2013  . HLD (hyperlipidemia) 05/09/2013    Lanice Shirts PT 10/15/2020, 2:37 PM  [place] [facility] [address] [place], [place], [address] Phone: [phone]   Fax:  [phone]  Name: Benjamin Todd MRN: [id number] Date of Birth: 1951-07-26

## 2020-10-15 NOTE — Patient Instructions (Signed)
Use lumbar support when sitting

## 2020-10-18 ENCOUNTER — Ambulatory Visit: Payer: PPO | Attending: Physical Medicine and Rehabilitation

## 2020-10-18 ENCOUNTER — Other Ambulatory Visit: Payer: Self-pay

## 2020-10-18 DIAGNOSIS — R2681 Unsteadiness on feet: Secondary | ICD-10-CM | POA: Diagnosis not present

## 2020-10-18 DIAGNOSIS — M542 Cervicalgia: Secondary | ICD-10-CM | POA: Insufficient documentation

## 2020-10-18 NOTE — Therapy (Addendum)
[facility] [address] [place], [place], [address] Phone: [phone]   Fax:  [phone]  Physical Therapy Treatment/DC summary  Patient Details  Name: Benjamin Todd MRN: [id number] Date of Birth: 03/05/1951 Referring Provider (PT): Megan Lovorn, MD   Encounter Date: 10/18/2020 PHYSICAL THERAPY DISCHARGE SUMMARY  Visits from Start of Care: 9  Current functional level related to goals / functional outcomes: UTA   Remaining deficits: UTA   Education / Equipment: HEP  Patient agrees to discharge. Patient goals were partially met. Patient is being discharged due to not returning since the last visit.    PT End of Session - 10/18/20 1349     Visit Number 9    Number of Visits 9    Date for PT Re-Evaluation 12/10/20    Authorization Type [employer]    PT Start Time 1230    PT Stop Time 1315    PT Time Calculation (min) 45 min    Equipment Utilized During Treatment Gait belt    Activity Tolerance Patient tolerated treatment well             Past Medical History:  Diagnosis Date   Decreased libido 05/18/2019   Essential hypertension, benign    Vitamin D deficiency disease     No past surgical history on file.  There were no vitals filed for this visit.   Subjective Assessment - 10/18/20 1246     Subjective has begun to increase activity at home including swing golf club and returning to yard work, most pain noted during eccentric phase of motion, still symptomatic but pain less and activity level increased    Pertinent History PMH: HTN, HLD    Limitations Lifting    How long can you sit comfortably? 30    Diagnostic tests MRI 08/07/20: of cervical spine shows moderate ot severe neural foraminal narrowing at C5/6 and C6/7 and myelomalacia at C5/6 with T2 hyperintense signal at C5/6.  X-RAY of L shoulder 08/27/20: Negative for acute bony abnormality. Degenerative changes of the AC joint  and GH joint    Patient Stated Goals wants to stop the pain in his shoulder    Currently in Pain? No/denies    Pain Orientation Left    Pain Onset More than a month ago                               [facility] Adult PT Treatment/Exercise - 10/18/20 0001       Knee/Hip Exercises: Aerobic   Nustep 8' at L2 arms at 12      Shoulder Exercises: Supine   Protraction Strengthening;Left;15 reps;Weights    Protraction Weight (lbs) 2.2# ball    Protraction Limitations 2x15 press then protract using2.2# ball    Flexion Strengthening;Left;15 reps;Weights    Shoulder Flexion Weight (lbs) 2.2# ball    Flexion Limitations 2x15      Shoulder Exercises: Seated   Other Seated Exercises scapular depression on mat(raising bttom off of mat)      Shoulder Exercises: Prone   Extension Strengthening;Left;15 reps;Weights    Extension Weight (lbs) 2.2# ball    Horizontal ABduction 1 Strengthening;Left;15 reps;Weights;Limitations    Horizontal ABduction 1 Weight (lbs) 2.2# bll    Horizontal ABduction 1 Limitations 15x with IR, 15x with ER    Other Prone Exercises prone scaption, 2x15 with 2.2# ball      Shoulder Exercises: Sidelying     External Rotation Strengthening;Left;15 reps;Weights;Limitations    External Rotation Weight (lbs) 2.2# ball    External Rotation Limitations R sidelie in 0d abduction      Manual Therapy   Soft tissue mobilization Performed manual stertching to ea. scalene and levator on L, 30s x1, followed by [facility] into R rotation, 5x at c-4,5,6                      PT Short Term Goals - 09/11/20 1429       PT SHORT TERM GOAL #1   Title ALL STGS = LTGS               PT Long Term Goals - 09/11/20 1438       PT LONG TERM GOAL #1   Title Pt will be independent with final HEP in order to build upon functional gains made in therapy. ALL LTGS DUE 10/16/20    Time 5   due to delay in scheduling   Period Weeks    Status New    Target Date 10/16/20       PT LONG TERM GOAL #2   Title DASH or NDI to be performed with goal written as appropriate.    Time 5    Period Weeks    Status New      PT LONG TERM GOAL #3   Title Pt will improve cervical rotation AROM to at least 55 degrees to the R and 45 degrees to the L in order to demo improved ROM for driving.    Baseline 42 deg to R, 22 deg to L    Time 5    Period Weeks    Status New      PT LONG TERM GOAL #4   Title Pt will improve R side bending AROM to at least 20 degrees with 2/10 or less pain.    Baseline 10 degrees with 5/10 pain on L side    Time 5    Period Weeks    Status New      PT LONG TERM GOAL #5   Title Pt will improve L shoulder flexion AROM to at least 115 degrees and L shoulder ABD AROM to at least 110 degrees in order to demo improved ROM for IADLS such as dressing.    Baseline L shoulder flexion 90 degrees with 5/10 pain, L shoulder ABD 85 degrees with 7/10 pain    Time 5    Period Weeks    Status New                   Plan - 10/18/20 1350     Clinical Impression Statement reporting less severe symptoms to the point where he has increaed his activity levels and has begun to swing golf clubs(irons) w/o increased severity of discomfort, feels the most painful activity is sleep but unable to identtify sleep position    Personal Factors and Comorbidities Comorbidity 2    Comorbidities HTN, HLD    Examination-Participation Restrictions Driving    Stability/Clinical Decision Making Evolving/Moderate complexity    Rehab Potential Good    PT Frequency 2x / week    PT Duration 4 weeks    PT Treatment/Interventions ADLs/Self Care Home Management;Functional mobility training;Therapeutic activities;Therapeutic exercise;Neuromuscular re-education;Balance training;Patient/family education;Manual techniques;Passive range of motion;Vestibular    PT Next Visit Plan recommend continue PT to focus on cervical mobility and shoulder pain, scalene and levator stretching  on L, work on Data processing manager  Patient will benefit from skilled therapeutic intervention in order to improve the following deficits and impairments:  Decreased coordination,Decreased range of motion,Decreased strength,Impaired sensation,Impaired UE functional use,Postural dysfunction,Pain,Decreased mobility  Visit Diagnosis: Unsteadiness on feet  Cervicalgia     Problem List Patient Active Problem List   Diagnosis Date Noted   Full incontinence of feces 07/16/2020   C1-C4 level spinal cord injury (HCC) 07/16/2020   Disease of spinal cord (HCC) 07/16/2020   Decreased libido 05/18/2019   Essential hypertension 04/16/2016   COPD exacerbation (HCC) 04/16/2016   Cervical spine arthritis 05/09/2013   Sensory neuropathy 05/09/2013   HLD (hyperlipidemia) 05/09/2013    Jeffrey M Ziemba PT 10/18/2020, 2:04 PM  [facility] [address] , [place], [address] Phone: [phone]   Fax:  [phone]  Name: Benjamin Todd MRN: [id number] Date of Birth: 06/28/1951   

## 2020-10-19 ENCOUNTER — Telehealth: Payer: Self-pay

## 2020-10-19 NOTE — Telephone Encounter (Signed)
Sounds perfect- thank you, ML

## 2020-10-22 ENCOUNTER — Encounter: Payer: Self-pay | Admitting: Physical Medicine and Rehabilitation

## 2020-10-22 ENCOUNTER — Other Ambulatory Visit: Payer: Self-pay

## 2020-10-22 ENCOUNTER — Encounter: Payer: PPO | Attending: Physical Medicine and Rehabilitation | Admitting: Physical Medicine and Rehabilitation

## 2020-10-22 VITALS — BP 153/81 | HR 92 | Temp 98.3°F | Ht 69.0 in | Wt 178.0 lb

## 2020-10-22 DIAGNOSIS — S14101D Unspecified injury at C1 level of cervical spinal cord, subsequent encounter: Secondary | ICD-10-CM | POA: Insufficient documentation

## 2020-10-22 DIAGNOSIS — R159 Full incontinence of feces: Secondary | ICD-10-CM | POA: Diagnosis not present

## 2020-10-22 DIAGNOSIS — N319 Neuromuscular dysfunction of bladder, unspecified: Secondary | ICD-10-CM | POA: Insufficient documentation

## 2020-10-22 DIAGNOSIS — M19012 Primary osteoarthritis, left shoulder: Secondary | ICD-10-CM | POA: Diagnosis not present

## 2020-10-22 DIAGNOSIS — R32 Unspecified urinary incontinence: Secondary | ICD-10-CM | POA: Diagnosis not present

## 2020-10-22 MED ORDER — BACLOFEN 5 MG PO TABS: 5.0000 mg | ORAL_TABLET | Freq: Two times a day (BID) | ORAL | 3 refills | Status: DC | PRN
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
## 2020-10-26 DIAGNOSIS — M4712 Other spondylosis with myelopathy, cervical region: Secondary | ICD-10-CM | POA: Diagnosis not present

## 2020-10-26 DIAGNOSIS — Z6826 Body mass index (BMI) 26.0-26.9, adult: Secondary | ICD-10-CM | POA: Diagnosis not present

## 2020-10-26 DIAGNOSIS — R030 Elevated blood-pressure reading, without diagnosis of hypertension: Secondary | ICD-10-CM | POA: Diagnosis not present

## 2020-10-29 ENCOUNTER — Ambulatory Visit: Payer: PPO | Admitting: Urology

## 2020-10-29 ENCOUNTER — Encounter: Payer: Self-pay | Admitting: Urology

## 2020-10-29 ENCOUNTER — Other Ambulatory Visit: Payer: Self-pay

## 2020-10-29 VITALS — BP 168/80 | HR 90 | Ht 68.0 in | Wt 176.0 lb

## 2020-10-29 DIAGNOSIS — R159 Full incontinence of feces: Secondary | ICD-10-CM | POA: Diagnosis not present

## 2020-10-29 DIAGNOSIS — R3129 Other microscopic hematuria: Secondary | ICD-10-CM | POA: Diagnosis not present

## 2020-10-29 DIAGNOSIS — R32 Unspecified urinary incontinence: Secondary | ICD-10-CM | POA: Diagnosis not present

## 2020-10-29 LAB — URINALYSIS, COMPLETE
Bilirubin, UA: NEGATIVE
Glucose, UA: NEGATIVE
Ketones, UA: NEGATIVE
Leukocytes,UA: NEGATIVE
Nitrite, UA: NEGATIVE
Protein,UA: NEGATIVE
Specific Gravity, UA: 1.025 (ref 1.005–1.030)
Urobilinogen, Ur: 0.2 mg/dL (ref 0.2–1.0)
pH, UA: 6.5 (ref 5.0–7.5)

## 2020-10-29 LAB — MICROSCOPIC EXAMINATION
Bacteria, UA: NONE SEEN
WBC, UA: NONE SEEN /hpf (ref 0–5)

## 2020-10-29 LAB — BLADDER SCAN AMB NON-IMAGING
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
## 2020-11-05 LAB — CULTURE, URINE COMPREHENSIVE
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
## 2020-12-19 ENCOUNTER — Encounter: Payer: PPO | Attending: Physical Medicine and Rehabilitation | Admitting: Physical Medicine and Rehabilitation

## 2020-12-19 ENCOUNTER — Encounter: Payer: Self-pay | Admitting: Physical Medicine and Rehabilitation

## 2020-12-19 ENCOUNTER — Other Ambulatory Visit: Payer: Self-pay

## 2020-12-19 VITALS — BP 159/78 | HR 70 | Temp 97.8°F | Ht 68.0 in | Wt 177.6 lb

## 2020-12-19 DIAGNOSIS — M75112 Incomplete rotator cuff tear or rupture of left shoulder, not specified as traumatic: Secondary | ICD-10-CM | POA: Diagnosis not present

## 2020-12-19 DIAGNOSIS — G9520 Unspecified cord compression: Secondary | ICD-10-CM | POA: Insufficient documentation
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
## 2021-01-28 ENCOUNTER — Other Ambulatory Visit (INDEPENDENT_AMBULATORY_CARE_PROVIDER_SITE_OTHER): Payer: Self-pay | Admitting: Internal Medicine

## 2021-02-04 ENCOUNTER — Encounter: Payer: Self-pay | Admitting: Physical Medicine and Rehabilitation

## 2021-02-04 ENCOUNTER — Encounter: Payer: PPO | Attending: Physical Medicine and Rehabilitation | Admitting: Physical Medicine and Rehabilitation

## 2021-02-04 ENCOUNTER — Other Ambulatory Visit: Payer: Self-pay

## 2021-02-04 VITALS — BP 180/93 | HR 79 | Temp 97.9°F | Ht 68.0 in | Wt 177.2 lb

## 2021-02-04 DIAGNOSIS — R3914 Feeling of incomplete bladder emptying: Secondary | ICD-10-CM | POA: Diagnosis not present

## 2021-02-04 DIAGNOSIS — M25512 Pain in left shoulder: Secondary | ICD-10-CM | POA: Diagnosis not present

## 2021-02-04 DIAGNOSIS — N401 Enlarged prostate with lower urinary tract symptoms: Secondary | ICD-10-CM | POA: Diagnosis not present
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
## 2021-02-04 NOTE — Patient Instructions (Signed)
Plan: Suggest Saw Palmetto for prostate. Can help swelling of prostate.and hep you void/pee better.  Wants shoulder injection.  Con't doing arm strengthening exercises- specifically on posterior back muscles- showed how ot do.  steroid injection was performed at bicipital tendons B/L using 1% plain Lidocaine and 40mg  /1cc of Kenalog. This was well tolerated. Cleaned with betadine x3 and allowed to dry- then alcohol then injected using 27 gauge 1.5 inch needle- no bleeding or complications.  F/U in 3 months for steroid injections of shoulders/biceps tendons.  Lidocaine will kick in 15 minutes- and wear off tonight- the steroid will kick in tomorrow within 24 hours and take up to 72 hours to fully kick in. Wait 3 days before weight for exercising- and wait til tomorrow to get back to normal activities- be careful.  F/U in 3 months- for L shoulder injection. If doesn't last more than 1-2 months- call me to let me use Celestone at next visit.

## 2021-02-10 ENCOUNTER — Encounter (INDEPENDENT_AMBULATORY_CARE_PROVIDER_SITE_OTHER): Payer: Self-pay | Admitting: Internal Medicine

## 2021-02-26 ENCOUNTER — Other Ambulatory Visit (INDEPENDENT_AMBULATORY_CARE_PROVIDER_SITE_OTHER): Payer: Self-pay | Admitting: Internal Medicine

## 2021-03-21 ENCOUNTER — Encounter (INDEPENDENT_AMBULATORY_CARE_PROVIDER_SITE_OTHER): Payer: Self-pay | Admitting: Nurse Practitioner

## 2021-03-21 ENCOUNTER — Telehealth (INDEPENDENT_AMBULATORY_CARE_PROVIDER_SITE_OTHER): Payer: Self-pay | Admitting: Nurse Practitioner

## 2021-03-21 NOTE — Telephone Encounter (Signed)
Please call patient to get him scheduled for an acute visit with me next week if possible.  I have been corresponding with him via MyChart so he should be aware that you will be calling him to schedule him.  Please allow for at least 40 minutes of possible so we can go through all medications and I can order any refills that he might need.  Thank you.

## 2021-03-21 NOTE — Telephone Encounter (Signed)
I am not sure if Benjamin Todd has been able to try to call this patient yet. Please try to call him on Monday (03/25/21) to get him scheduled for an acute visit (40 minutes) sometime this week with me. Thank you.

## 2021-03-25 NOTE — Telephone Encounter (Signed)
Left a detailed voice message for patient to call back to schedule an acute this week with Judson Roch.

## 2021-03-25 NOTE — Telephone Encounter (Signed)
Please make sure pt can get Celestone on B?L shoulder injections in mid-late September- thanks- ML

## 2021-03-27 ENCOUNTER — Other Ambulatory Visit: Payer: Self-pay

## 2021-03-27 ENCOUNTER — Encounter (INDEPENDENT_AMBULATORY_CARE_PROVIDER_SITE_OTHER): Payer: Self-pay | Admitting: Nurse Practitioner

## 2021-03-27 ENCOUNTER — Ambulatory Visit (INDEPENDENT_AMBULATORY_CARE_PROVIDER_SITE_OTHER): Payer: PPO | Admitting: Nurse Practitioner

## 2021-03-27 VITALS — BP 138/74 | HR 91 | Temp 99.2°F | Ht 68.0 in | Wt 177.0 lb

## 2021-03-27 DIAGNOSIS — N529 Male erectile dysfunction, unspecified: Secondary | ICD-10-CM | POA: Diagnosis not present

## 2021-03-27 DIAGNOSIS — R6882 Decreased libido: Secondary | ICD-10-CM | POA: Diagnosis not present

## 2021-03-27 MED ORDER — TADALAFIL 20 MG PO TABS
20.0000 mg | ORAL_TABLET | Freq: Every day | ORAL | 2 refills | Status: AC | PRN
Start: 2021-03-27 — End: ?

## 2021-03-27 NOTE — Progress Notes (Signed)
Subjective:  Patient ID: Benjamin Todd, male    DOB: [birth year]  Age: 70 y.o. MRN: [id number]  CC:  Chief Complaint  Patient presents with   Follow-up    Discuss labs and medications      HPI  This patient arrives today for the above.  He had requested a refill on sildenafil and Viagra and testosterone.  He tells me he rotates between sildenafil and Viagra on a daily basis for treatment of his hypertension.  He takes testosterone for symptoms of hypogonadism.  Per chart review I do not see that he has had a CBC or testosterone level checked since February 2021.  He has no acute complaints today and tells me he feels well.  He is hoping to get medications refilled while he is waiting to be established with a new primary care provider.  Past Medical History:  Diagnosis Date   Decreased libido 05/18/2019   Essential hypertension, benign    Vitamin D deficiency disease       Family History  Problem Relation Age of Onset   Cancer Mother        breast   Hypertension Father     Social History   Social History Narrative   Married for since 1979.Lives with wife and son.Occ substitute Pharmacist, hospital.   Social History   Tobacco Use   Smoking status: Former    Types: Cigars    Quit date: 08/11/2017    Years since quitting: 3.6   Smokeless tobacco: Never  Substance Use Topics   Alcohol use: Not Currently     Current Meds  Medication Sig   Cholecalciferol (VITAMIN D3) 125 MCG (5000 UT) TABS Take 2 tablets by mouth daily.   naproxen sodium (ALEVE) 220 MG tablet    sildenafil (VIAGRA) 100 MG tablet TAKE 1 TABLET BY MOUTH ONCE DAILY AS NEEDED FOR ERECTILE DYSFUNCTION   tadalafil (CIALIS) 20 MG tablet TAKE 1 TABLET BY MOUTH DAILY AS NEEDED FOR ERECTILE DYSFUNCTION   testosterone cypionate (DEPOTESTOSTERONE CYPIONATE) 200 MG/ML injection INJECT 0.5ML INTRAMUSCULARLY ONCE A WEEK    ROS:  Review of Systems  Respiratory:  Negative for shortness of breath.   Cardiovascular:   Negative for chest pain and palpitations.  Neurological:  Negative for dizziness and headaches.    Objective:   Today's Vitals: BP 138/74   Pulse 91   Temp 99.2 F (37.3 C) (Temporal)   Ht '5\' 8"'$  (1.727 m)   Wt 177 lb (80.3 kg)   SpO2 94%   BMI 26.91 kg/m  Vitals with BMI 03/27/2021 02/04/2021 12/19/2020  Height '5\' 8"'$  '5\' 8"'$  '5\' 8"'$   Weight 177 lbs 177 lbs 3 oz 177 lbs 10 oz  BMI 26.92 Q000111Q 0000000  Systolic 0000000 [id number] [id number]  Diastolic 74 93 78  Pulse 91 79 70     Physical Exam Vitals reviewed.  Constitutional:      Appearance: Normal appearance.  HENT:     Head: Normocephalic and atraumatic.  Cardiovascular:     Rate and Rhythm: Normal rate and regular rhythm.  Pulmonary:     Effort: Pulmonary effort is normal.     Breath sounds: Normal breath sounds.  Musculoskeletal:     Cervical back: Neck supple.  Skin:    General: Skin is warm and dry.  Neurological:     Mental Status: He is alert and oriented to person, place, and time.  Psychiatric:        Mood and Affect: Mood normal.  Behavior: Behavior normal.        Thought Content: Thought content normal.        Judgment: Judgment normal.         Assessment and Plan   1. Erectile dysfunction, unspecified erectile dysfunction type   2. Decreased libido      Plan: 1.,  2.  I had discussion with him regarding the sildenafil and Cialis and that these are not FDA approved for treatment of hypertension.  I see that he is not on any antihypertensives and that if he needs it medicine for his blood pressure he should be put on a different medication.  He tells me he does not trust antihypertensives as they come from [place], and he does not want to take these medications.  I recommend that he does not take both sildenafil and Cialis, but he can continue on one of the other as needed for erectile dysfunction but not for the treatment of hypertension.  He would like to take the Cialis.  We will refill this medication and will  discontinue Viagra.  As for testosterone, I told him I am not comfortable refilling this medication without seeing blood work so I will order testosterone level as well as CBC today for further evaluation.  Further recommendations regarding refill of testosterone will be made once these results have returned.  He does monitor his blood pressure at home and he was told if his blood pressure is elevated at home he needs to call his office between now and when we closed permanently later this month.  He tells me he understands.  He was given a list of providers that he can contact for hormone replacement therapy treatment.   Tests ordered Orders Placed This Encounter  Procedures   CBC   Testosterone Total,Free,Bio, Males      No orders of the defined types were placed in this encounter.  Patient scheduled for follow-up as this office is closing permanently as of 04/17/21 due to the passing of Dr. Anastasio Champion.  The patient was notified of this and that they will need to find a new primary care provider.  They express understanding.   Ailene Ards, NP

## 2021-03-27 NOTE — Telephone Encounter (Signed)
Patient came into the office today and was scheduled a 3pm appointment for today.

## 2021-03-28 ENCOUNTER — Encounter (INDEPENDENT_AMBULATORY_CARE_PROVIDER_SITE_OTHER): Payer: Self-pay | Admitting: Nurse Practitioner

## 2021-03-28 ENCOUNTER — Telehealth (INDEPENDENT_AMBULATORY_CARE_PROVIDER_SITE_OTHER): Payer: Self-pay | Admitting: Nurse Practitioner

## 2021-03-28 DIAGNOSIS — R6882 Decreased libido: Secondary | ICD-10-CM

## 2021-03-28 DIAGNOSIS — N529 Male erectile dysfunction, unspecified: Secondary | ICD-10-CM

## 2021-03-28 LAB — TESTOSTERONE TOTAL,FREE,BIO, MALES
Albumin: 4.3 g/dL (ref 3.6–5.1)
Sex Hormone Binding: 66 nmol/L (ref 22–77)
Testosterone, Bioavailable: 263.8 ng/dL — ABNORMAL HIGH (ref 15.0–150.0)
Testosterone, Free: 133.9 pg/mL — ABNORMAL HIGH (ref 6.0–73.0)
Testosterone: 1471 ng/dL — ABNORMAL HIGH (ref 250–827)

## 2021-03-28 LAB — CBC
HCT: 56.8 % — ABNORMAL HIGH (ref 38.5–50.0)
Hemoglobin: 18.7 g/dL — ABNORMAL HIGH (ref 13.2–17.1)
MCH: 28.2 pg (ref 27.0–33.0)
MCHC: 32.9 g/dL (ref 32.0–36.0)
MCV: 85.7 fL (ref 80.0–100.0)
MPV: 12.4 fL (ref 7.5–12.5)
Platelets: 181 10*3/uL (ref 140–400)
RBC: 6.63 10*6/uL — ABNORMAL HIGH (ref 4.20–5.80)
RDW: 13.6 % (ref 11.0–15.0)
WBC: 10.5 10*3/uL (ref 3.8–10.8)

## 2021-03-28 MED ORDER — TESTOSTERONE CYPIONATE 100 MG/ML IJ SOLN
0.5000 mL | INTRAMUSCULAR | 0 refills | Status: DC
Start: 2021-03-28 — End: 2021-04-03

## 2021-03-28 NOTE — Telephone Encounter (Signed)
Called patient and gave him the message. Patient verbalized an understanding. 

## 2021-03-28 NOTE — Telephone Encounter (Signed)
Please call patient let him know that his blood work has resulted.  His testosterone levels are quite high and his hematocrit is also elevated.  Thus, I recommend we reduce his dose of testosterone from 100 mg injected intramuscularly every week to 50 mg injected intramuscularly every week.  I will send in a new prescription to the pharmacy.  I will change the concentration to his testosterone so he should still inject 0.5 mL intramuscularly weekly (there will just be a lower dose of testosterone in each injection).  He will also need to follow-up with another provider to take over his hormone management moving forward.  Please let me know if he has any questions.  Thank you.

## 2021-04-01 ENCOUNTER — Encounter (INDEPENDENT_AMBULATORY_CARE_PROVIDER_SITE_OTHER): Payer: Self-pay | Admitting: Nurse Practitioner

## 2021-04-03 ENCOUNTER — Other Ambulatory Visit (INDEPENDENT_AMBULATORY_CARE_PROVIDER_SITE_OTHER): Payer: Self-pay | Admitting: Nurse Practitioner

## 2021-04-03 DIAGNOSIS — R6882 Decreased libido: Secondary | ICD-10-CM

## 2021-04-03 DIAGNOSIS — N529 Male erectile dysfunction, unspecified: Secondary | ICD-10-CM

## 2021-04-03 MED ORDER — TESTOSTERONE CYPIONATE 100 MG/ML IJ SOLN: 0.5000 mL | INTRAMUSCULAR | 0 refills | Status: DC

## 2021-04-16 ENCOUNTER — Ambulatory Visit (INDEPENDENT_AMBULATORY_CARE_PROVIDER_SITE_OTHER): Payer: PPO | Admitting: Nurse Practitioner

## 2021-04-17 ENCOUNTER — Encounter (INDEPENDENT_AMBULATORY_CARE_PROVIDER_SITE_OTHER): Payer: PPO | Admitting: Nurse Practitioner

## 2021-04-29 DIAGNOSIS — H40033 Anatomical narrow angle, bilateral: Secondary | ICD-10-CM | POA: Diagnosis not present

## 2021-04-29 DIAGNOSIS — Z961 Presence of intraocular lens: Secondary | ICD-10-CM | POA: Diagnosis not present

## 2021-04-29 DIAGNOSIS — H35353 Cystoid macular degeneration, bilateral: Secondary | ICD-10-CM | POA: Diagnosis not present

## 2021-05-08 DIAGNOSIS — H35351 Cystoid macular degeneration, right eye: Secondary | ICD-10-CM | POA: Diagnosis not present

## 2021-05-08 DIAGNOSIS — H30023 Focal chorioretinal inflammation of posterior pole, bilateral: Secondary | ICD-10-CM | POA: Diagnosis not present

## 2021-05-08 DIAGNOSIS — H02205 Unspecified lagophthalmos left lower eyelid: Secondary | ICD-10-CM | POA: Diagnosis not present

## 2021-05-08 DIAGNOSIS — H04123 Dry eye syndrome of bilateral lacrimal glands: Secondary | ICD-10-CM | POA: Diagnosis not present

## 2021-05-08 DIAGNOSIS — H35352 Cystoid macular degeneration, left eye: Secondary | ICD-10-CM | POA: Diagnosis not present

## 2021-05-08 DIAGNOSIS — H02202 Unspecified lagophthalmos right lower eyelid: Secondary | ICD-10-CM | POA: Diagnosis not present

## 2021-05-08 DIAGNOSIS — H35353 Cystoid macular degeneration, bilateral: Secondary | ICD-10-CM | POA: Diagnosis not present

## 2021-05-10 ENCOUNTER — Encounter: Payer: PPO | Admitting: Physical Medicine and Rehabilitation

## 2021-05-23 DIAGNOSIS — H35353 Cystoid macular degeneration, bilateral: Secondary | ICD-10-CM | POA: Diagnosis not present

## 2021-05-23 DIAGNOSIS — H35351 Cystoid macular degeneration, right eye: Secondary | ICD-10-CM | POA: Diagnosis not present

## 2021-05-23 DIAGNOSIS — H35352 Cystoid macular degeneration, left eye: Secondary | ICD-10-CM | POA: Diagnosis not present

## 2021-05-23 DIAGNOSIS — H30023 Focal chorioretinal inflammation of posterior pole, bilateral: Secondary | ICD-10-CM | POA: Diagnosis not present

## 2021-05-23 DIAGNOSIS — H3581 Retinal edema: Secondary | ICD-10-CM | POA: Diagnosis not present

## 2021-06-03 ENCOUNTER — Encounter: Payer: PPO | Attending: Physical Medicine and Rehabilitation | Admitting: Physical Medicine and Rehabilitation

## 2021-06-03 ENCOUNTER — Encounter: Payer: Self-pay | Admitting: Physical Medicine and Rehabilitation

## 2021-06-03 ENCOUNTER — Other Ambulatory Visit: Payer: Self-pay

## 2021-06-03 VITALS — Temp 98.4°F | Wt 176.0 lb

## 2021-06-03 DIAGNOSIS — M25512 Pain in left shoulder: Secondary | ICD-10-CM

## 2021-06-03 DIAGNOSIS — S14101D Unspecified injury at C1 level of cervical spinal cord, subsequent encounter: Secondary | ICD-10-CM

## 2021-06-03 DIAGNOSIS — M75112 Incomplete rotator cuff tear or rupture of left shoulder, not specified as traumatic: Secondary | ICD-10-CM | POA: Diagnosis not present

## 2021-06-03 DIAGNOSIS — M7522 Bicipital tendinitis, left shoulder: Secondary | ICD-10-CM | POA: Diagnosis not present
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
## 2021-06-27 DIAGNOSIS — H40052 Ocular hypertension, left eye: Secondary | ICD-10-CM | POA: Diagnosis not present

## 2021-06-27 DIAGNOSIS — H35353 Cystoid macular degeneration, bilateral: Secondary | ICD-10-CM | POA: Diagnosis not present

## 2021-06-27 DIAGNOSIS — H30023 Focal chorioretinal inflammation of posterior pole, bilateral: Secondary | ICD-10-CM | POA: Diagnosis not present

## 2021-07-03 DIAGNOSIS — R159 Full incontinence of feces: Secondary | ICD-10-CM | POA: Diagnosis not present

## 2021-07-03 DIAGNOSIS — M542 Cervicalgia: Secondary | ICD-10-CM | POA: Diagnosis not present

## 2021-07-03 DIAGNOSIS — E291 Testicular hypofunction: Secondary | ICD-10-CM | POA: Diagnosis not present

## 2021-07-03 DIAGNOSIS — I10 Essential (primary) hypertension: Secondary | ICD-10-CM | POA: Diagnosis not present

## 2021-07-08 DIAGNOSIS — H40052 Ocular hypertension, left eye: Secondary | ICD-10-CM | POA: Diagnosis not present

## 2021-07-08 DIAGNOSIS — H35353 Cystoid macular degeneration, bilateral: Secondary | ICD-10-CM | POA: Diagnosis not present

## 2021-07-08 DIAGNOSIS — H02202 Unspecified lagophthalmos right lower eyelid: Secondary | ICD-10-CM | POA: Diagnosis not present

## 2021-07-08 DIAGNOSIS — H02205 Unspecified lagophthalmos left lower eyelid: Secondary | ICD-10-CM | POA: Diagnosis not present

## 2021-07-15 DIAGNOSIS — I10 Essential (primary) hypertension: Secondary | ICD-10-CM | POA: Diagnosis not present

## 2021-07-15 DIAGNOSIS — E291 Testicular hypofunction: Secondary | ICD-10-CM | POA: Diagnosis not present

## 2021-07-18 DIAGNOSIS — R7303 Prediabetes: Secondary | ICD-10-CM | POA: Diagnosis not present

## 2021-07-18 DIAGNOSIS — R718 Other abnormality of red blood cells: Secondary | ICD-10-CM | POA: Diagnosis not present

## 2021-07-18 DIAGNOSIS — F5221 Male erectile disorder: Secondary | ICD-10-CM | POA: Diagnosis not present

## 2021-07-18 DIAGNOSIS — R159 Full incontinence of feces: Secondary | ICD-10-CM | POA: Diagnosis not present

## 2021-07-18 DIAGNOSIS — E291 Testicular hypofunction: Secondary | ICD-10-CM | POA: Diagnosis not present

## 2021-07-18 DIAGNOSIS — M542 Cervicalgia: Secondary | ICD-10-CM | POA: Diagnosis not present

## 2021-07-18 DIAGNOSIS — I10 Essential (primary) hypertension: Secondary | ICD-10-CM | POA: Diagnosis not present

## 2021-07-18 DIAGNOSIS — Z0001 Encounter for general adult medical examination with abnormal findings: Secondary | ICD-10-CM | POA: Diagnosis not present

## 2021-07-30 DIAGNOSIS — Z961 Presence of intraocular lens: Secondary | ICD-10-CM | POA: Diagnosis not present

## 2021-07-30 DIAGNOSIS — H5212 Myopia, left eye: Secondary | ICD-10-CM | POA: Diagnosis not present

## 2021-07-30 DIAGNOSIS — H35353 Cystoid macular degeneration, bilateral: Secondary | ICD-10-CM | POA: Diagnosis not present

## 2021-08-15 DIAGNOSIS — E291 Testicular hypofunction: Secondary | ICD-10-CM | POA: Diagnosis not present

## 2021-08-15 DIAGNOSIS — M75102 Unspecified rotator cuff tear or rupture of left shoulder, not specified as traumatic: Secondary | ICD-10-CM | POA: Diagnosis not present

## 2021-08-15 DIAGNOSIS — R159 Full incontinence of feces: Secondary | ICD-10-CM | POA: Diagnosis not present

## 2021-08-15 DIAGNOSIS — D45 Polycythemia vera: Secondary | ICD-10-CM | POA: Diagnosis not present

## 2021-08-15 DIAGNOSIS — I10 Essential (primary) hypertension: Secondary | ICD-10-CM | POA: Diagnosis not present

## 2021-08-15 DIAGNOSIS — F5221 Male erectile disorder: Secondary | ICD-10-CM | POA: Diagnosis not present

## 2021-08-15 DIAGNOSIS — H40059 Ocular hypertension, unspecified eye: Secondary | ICD-10-CM | POA: Diagnosis not present

## 2021-08-26 DIAGNOSIS — H30111 Disseminated chorioretinal inflammation of posterior pole, right eye: Secondary | ICD-10-CM | POA: Diagnosis not present

## 2021-08-26 DIAGNOSIS — H02202 Unspecified lagophthalmos right lower eyelid: Secondary | ICD-10-CM | POA: Diagnosis not present

## 2021-08-26 DIAGNOSIS — H35351 Cystoid macular degeneration, right eye: Secondary | ICD-10-CM | POA: Diagnosis not present

## 2021-08-26 DIAGNOSIS — H40052 Ocular hypertension, left eye: Secondary | ICD-10-CM | POA: Diagnosis not present

## 2021-08-26 DIAGNOSIS — H02205 Unspecified lagophthalmos left lower eyelid: Secondary | ICD-10-CM | POA: Diagnosis not present

## 2021-08-26 DIAGNOSIS — H04123 Dry eye syndrome of bilateral lacrimal glands: Secondary | ICD-10-CM | POA: Diagnosis not present

## 2021-09-02 ENCOUNTER — Ambulatory Visit: Payer: PPO | Admitting: Physical Medicine and Rehabilitation

## 2021-09-09 DIAGNOSIS — H02202 Unspecified lagophthalmos right lower eyelid: Secondary | ICD-10-CM | POA: Diagnosis not present

## 2021-09-09 DIAGNOSIS — H30023 Focal chorioretinal inflammation of posterior pole, bilateral: Secondary | ICD-10-CM | POA: Diagnosis not present

## 2021-09-09 DIAGNOSIS — H40052 Ocular hypertension, left eye: Secondary | ICD-10-CM | POA: Diagnosis not present

## 2021-09-09 DIAGNOSIS — H02205 Unspecified lagophthalmos left lower eyelid: Secondary | ICD-10-CM | POA: Diagnosis not present

## 2021-09-09 DIAGNOSIS — H35353 Cystoid macular degeneration, bilateral: Secondary | ICD-10-CM | POA: Diagnosis not present
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
## 2021-09-12 ENCOUNTER — Encounter (HOSPITAL_COMMUNITY): Payer: Self-pay | Admitting: Hematology

## 2021-09-12 ENCOUNTER — Other Ambulatory Visit: Payer: Self-pay

## 2021-09-12 ENCOUNTER — Inpatient Hospital Stay (HOSPITAL_COMMUNITY): Payer: PPO | Attending: Hematology | Admitting: Hematology

## 2021-09-12 ENCOUNTER — Inpatient Hospital Stay (HOSPITAL_COMMUNITY): Payer: PPO

## 2021-09-12 VITALS — BP 170/92 | HR 62 | Temp 96.3°F | Resp 18 | Ht 68.9 in | Wt 169.8 lb

## 2021-09-12 DIAGNOSIS — D751 Secondary polycythemia: Secondary | ICD-10-CM | POA: Insufficient documentation

## 2021-09-12 DIAGNOSIS — Z803 Family history of malignant neoplasm of breast: Secondary | ICD-10-CM | POA: Diagnosis not present

## 2021-09-12 DIAGNOSIS — R7989 Other specified abnormal findings of blood chemistry: Secondary | ICD-10-CM

## 2021-09-12 DIAGNOSIS — E291 Testicular hypofunction: Secondary | ICD-10-CM

## 2021-09-12 DIAGNOSIS — I10 Essential (primary) hypertension: Secondary | ICD-10-CM | POA: Insufficient documentation

## 2021-09-12 DIAGNOSIS — Z87891 Personal history of nicotine dependence: Secondary | ICD-10-CM | POA: Insufficient documentation

## 2021-09-12 LAB — CBC WITH DIFFERENTIAL/PLATELET
Abs Immature Granulocytes: 0.04 10*3/uL (ref 0.00–0.07)
Basophils Absolute: 0.1 10*3/uL (ref 0.0–0.1)
Basophils Relative: 1 %
Eosinophils Absolute: 0.3 10*3/uL (ref 0.0–0.5)
Eosinophils Relative: 3 %
HCT: 57.6 % — ABNORMAL HIGH (ref 39.0–52.0)
Hemoglobin: 18.9 g/dL — ABNORMAL HIGH (ref 13.0–17.0)
Immature Granulocytes: 0 %
Lymphocytes Relative: 17 %
Lymphs Abs: 1.7 10*3/uL (ref 0.7–4.0)
MCH: 30.1 pg (ref 26.0–34.0)
MCHC: 32.8 g/dL (ref 30.0–36.0)
MCV: 91.9 fL (ref 80.0–100.0)
Monocytes Absolute: 0.7 10*3/uL (ref 0.1–1.0)
Monocytes Relative: 7 %
Neutro Abs: 7.3 10*3/uL (ref 1.7–7.7)
Neutrophils Relative %: 72 %
Platelets: 195 10*3/uL (ref 150–400)
RBC: 6.27 MIL/uL — ABNORMAL HIGH (ref 4.22–5.81)
RDW: 14 % (ref 11.5–15.5)
WBC: 10 10*3/uL (ref 4.0–10.5)
nRBC: 0 % (ref 0.0–0.2)

## 2021-09-12 LAB — IRON AND TIBC
Iron: 93 ug/dL (ref 45–182)
Saturation Ratios: 22 % (ref 17.9–39.5)
TIBC: 420 ug/dL (ref 250–450)
UIBC: 327 ug/dL

## 2021-09-12 LAB — FERRITIN: Ferritin: 300 ng/mL (ref 24–336)

## 2021-09-12 NOTE — Patient Instructions (Addendum)
[facility] Discharge Instructions  You were seen today by Dr. Delton Coombes & Tarri Abernethy PA-C for your elevated blood counts and elevated iron.  We suspect your elevated blood counts are related to your testosterone injections.  However, from our standpoint, you can continue to receive testosterone injections from your primary care provider.  We will manage your elevated blood counts with phlebotomy (blood donation).  We will check some other tests to see if there are any other causes of your blood counts being elevated, such as certain genetic mutations that can affect your blood counts and your iron levels.  LABS: Check labs today before leaving the hospital (on first floor)  TREATMENT: - START taking aspirin 81 mg daily to decrease the risk of blood clots, heart attack, and stroke in the setting of elevated blood counts - You can continue to donate blood every other month.  FOLLOW-UP APPOINTMENT: Office visit in 2 weeks to discuss lab results   Thank you for choosing [facility] to provide your oncology and hematology care.  To afford each patient quality time with our provider, please arrive at least 15 minutes before your scheduled appointment time.   If you have a lab appointment with the [facility] please come in thru the Main Entrance and check in at the main information desk.  You need to re-schedule your appointment should you arrive 10 or more minutes late.  We strive to give you quality time with our providers, and arriving late affects you and other patients whose appointments are after yours.  Also, if you no show three or more times for appointments you may be dismissed from the clinic at the providers discretion.     Again, thank you for choosing [facility].  Our hope is that these requests will decrease the amount of time that you wait before being seen by our physicians.        _____________________________________________________________  Should you have questions after your visit to [facility], please contact our office at [phone] and follow the prompts.  Our office hours are 8:00 a.m. and 4:30 p.m. Monday - Friday.  Please note that voicemails left after 4:00 p.m. may not be returned until the following business day.  We are closed weekends and major holidays.  You do have access to a nurse 24-7, just call the main number to the clinic [phone] and do not press any options, hold on the line and a nurse will answer the phone.    For prescription refill requests, have your pharmacy contact our office and allow 72 hours.    Due to Covid, you will need to wear a mask upon entering the hospital. If you do not have a mask, a mask will be given to you at the Main Entrance upon arrival. For doctor visits, patients may have 1 support person age 28 or older with them. For treatment visits, patients can not have anyone with them due to social distancing guidelines and our immunocompromised population.

## 2021-09-13 ENCOUNTER — Other Ambulatory Visit: Payer: Self-pay

## 2021-09-13 ENCOUNTER — Encounter: Payer: Self-pay | Admitting: Physical Medicine and Rehabilitation

## 2021-09-13 ENCOUNTER — Encounter: Payer: PPO | Attending: Physical Medicine and Rehabilitation | Admitting: Physical Medicine and Rehabilitation

## 2021-09-13 VITALS — BP 180/68 | HR 58 | Temp 98.0°F | Ht 68.9 in | Wt 169.4 lb

## 2021-09-13 DIAGNOSIS — K592 Neurogenic bowel, not elsewhere classified: Secondary | ICD-10-CM | POA: Diagnosis not present

## 2021-09-13 DIAGNOSIS — R32 Unspecified urinary incontinence: Secondary | ICD-10-CM | POA: Insufficient documentation

## 2021-09-13 DIAGNOSIS — M25512 Pain in left shoulder: Secondary | ICD-10-CM | POA: Diagnosis not present

## 2021-09-13 DIAGNOSIS — R159 Full incontinence of feces: Secondary | ICD-10-CM | POA: Insufficient documentation

## 2021-09-13 DIAGNOSIS — S14101D Unspecified injury at C1 level of cervical spinal cord, subsequent encounter: Secondary | ICD-10-CM | POA: Diagnosis not present

## 2021-09-13 LAB — ERYTHROPOIETIN: Erythropoietin: 4.2 m[IU]/mL (ref 2.6–18.5)

## 2021-09-13 LAB — TESTOSTERONE: Testosterone: 1216 ng/dL — ABNORMAL HIGH (ref 264–916)
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
## 2021-09-13 NOTE — Patient Instructions (Signed)
Plan: Still unable to work due to bowel incontinence-- due to C5/7- foraminal stenosis/C5 myelopathy.    They are checking into hemachromatosis- Hb 18.9- speak with PCP about it. Went over the dx and how can cause liver damage long term and sounds like he's had issues since in 30s with high Hb- he thinks it was COVID shot ,but sounds like he's had longer.    3.  Doesn't want to get steroid injection in shoulder. Suggest Aleve as needed for L shoulder pain if not doing injection   4.  Went over that Cervical compression is why having bowel accidents- and so doing surgery won't make it better- just stop things from getting worse.   5. Will see how bowels go and L shoulder and let me know at next visit.    6. Needs to do bowel program nightly- will greatly improve bowel accidents- using suppository and digital stimulation to empty gut so can get stool out of gut, so nothing to have accident with.    7. F/U in 3 months-

## 2021-09-17 LAB — TESTOSTERONE, FREE: Testosterone, Free: 11.1 pg/mL (ref 6.6–18.1)

## 2021-09-18 LAB — HEMOCHROMATOSIS DNA-PCR(C282Y,H63D)

## 2021-09-23 LAB — CALR + JAK2 E12-15 + MPL (REFLEXED)

## 2021-09-23 LAB — JAK2 V617F, W REFLEX TO CALR/E12/MPL
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
## 2021-09-27 ENCOUNTER — Inpatient Hospital Stay (HOSPITAL_COMMUNITY): Payer: PPO | Attending: Hematology | Admitting: Physician Assistant

## 2021-09-27 ENCOUNTER — Other Ambulatory Visit: Payer: Self-pay

## 2021-09-27 VITALS — BP 173/79 | HR 72 | Temp 97.8°F | Resp 18 | Ht 66.0 in | Wt 166.7 lb

## 2021-09-27 DIAGNOSIS — Z87891 Personal history of nicotine dependence: Secondary | ICD-10-CM | POA: Diagnosis not present

## 2021-09-27 DIAGNOSIS — I10 Essential (primary) hypertension: Secondary | ICD-10-CM | POA: Insufficient documentation

## 2021-09-27 DIAGNOSIS — D751 Secondary polycythemia: Secondary | ICD-10-CM | POA: Insufficient documentation

## 2021-09-27 DIAGNOSIS — Z803 Family history of malignant neoplasm of breast: Secondary | ICD-10-CM | POA: Insufficient documentation

## 2021-09-27 DIAGNOSIS — R7989 Other specified abnormal findings of blood chemistry: Secondary | ICD-10-CM | POA: Insufficient documentation

## 2021-09-27 NOTE — Patient Instructions (Signed)
[place] at [facility] Discharge Instructions  You were seen today by Tarri Abernethy PA-C for your elevated red blood cells and elevated iron.    As we discussed, your elevated red blood cells are caused by your testosterone shots.  You can continue your testosterone, but need to donate blood once every 2 months to keep your blood at a safe level.  Your elevated blood levels place you at an increased risk of heart attack and stroke, so I recommend that you take aspirin 81 mg daily to prevent these life-threatening events.  Your iron levels are on the higher side because you have one copy of a gene that makes your body absorb extra iron.  This is not dangerous to you, but we will monitor it at your follow up visit.    LABS: Return in 4 months for repeat labs   MEDICATIONS: Aspirin 81 mg daily  FOLLOW-UP APPOINTMENT: Office visit in 4 months   Thank you for choosing [place] at [facility] to provide your oncology and hematology care.  To afford each patient quality time with our provider, please arrive at least 15 minutes before your scheduled appointment time.   If you have a lab appointment with the [facility] please come in thru the Main Entrance and check in at the main information desk.  You need to re-schedule your appointment should you arrive 10 or more minutes late.  We strive to give you quality time with our providers, and arriving late affects you and other patients whose appointments are after yours.  Also, if you no show three or more times for appointments you may be dismissed from the clinic at the providers discretion.     Again, thank you for choosing [facility].  Our hope is that these requests will decrease the amount of time that you wait before being seen by our physicians.       _____________________________________________________________  Should you have questions after your visit to [facility], please contact our office at [phone] and follow the prompts.  Our office hours are 8:00 a.m. and 4:30 p.m. Monday - Friday.  Please note that voicemails left after 4:00 p.m. may not be returned until the following business day.  We are closed weekends and major holidays.  You do have access to a nurse 24-7, just call the main number to the clinic [phone] and do not press any options, hold on the line and a nurse will answer the phone.    For prescription refill requests, have your pharmacy contact our office and allow 72 hours.    Due to Covid, you will need to wear a mask upon entering the hospital. If you do not have a mask, a mask will be given to you at the Main Entrance upon arrival. For doctor visits, patients may have 1 support person age 73 or older with them. For treatment visits, patients can not have anyone with them due to social distancing guidelines and our immunocompromised population.

## 2021-10-09 DIAGNOSIS — H40052 Ocular hypertension, left eye: Secondary | ICD-10-CM | POA: Diagnosis not present

## 2021-10-09 DIAGNOSIS — H3581 Retinal edema: Secondary | ICD-10-CM | POA: Diagnosis not present

## 2021-10-09 DIAGNOSIS — H04123 Dry eye syndrome of bilateral lacrimal glands: Secondary | ICD-10-CM | POA: Diagnosis not present

## 2021-10-09 DIAGNOSIS — H30111 Disseminated chorioretinal inflammation of posterior pole, right eye: Secondary | ICD-10-CM | POA: Diagnosis not present

## 2021-10-21 DIAGNOSIS — H02205 Unspecified lagophthalmos left lower eyelid: Secondary | ICD-10-CM | POA: Diagnosis not present

## 2021-10-21 DIAGNOSIS — H04123 Dry eye syndrome of bilateral lacrimal glands: Secondary | ICD-10-CM | POA: Diagnosis not present

## 2021-10-21 DIAGNOSIS — H30111 Disseminated chorioretinal inflammation of posterior pole, right eye: Secondary | ICD-10-CM | POA: Diagnosis not present

## 2021-10-21 DIAGNOSIS — H02202 Unspecified lagophthalmos right lower eyelid: Secondary | ICD-10-CM | POA: Diagnosis not present

## 2021-10-21 DIAGNOSIS — H35351 Cystoid macular degeneration, right eye: Secondary | ICD-10-CM | POA: Diagnosis not present

## 2021-10-21 DIAGNOSIS — H3581 Retinal edema: Secondary | ICD-10-CM | POA: Diagnosis not present

## 2021-10-29 DIAGNOSIS — H35353 Cystoid macular degeneration, bilateral: Secondary | ICD-10-CM | POA: Diagnosis not present

## 2021-10-29 DIAGNOSIS — H40033 Anatomical narrow angle, bilateral: Secondary | ICD-10-CM | POA: Diagnosis not present

## 2021-11-29 DIAGNOSIS — I10 Essential (primary) hypertension: Secondary | ICD-10-CM | POA: Diagnosis not present

## 2021-12-13 ENCOUNTER — Encounter: Payer: Self-pay | Admitting: Physical Medicine and Rehabilitation

## 2021-12-13 ENCOUNTER — Encounter: Payer: PPO | Attending: Physical Medicine and Rehabilitation | Admitting: Physical Medicine and Rehabilitation

## 2021-12-13 VITALS — BP 155/88 | HR 74 | Ht 66.0 in | Wt 175.0 lb

## 2021-12-13 DIAGNOSIS — N319 Neuromuscular dysfunction of bladder, unspecified: Secondary | ICD-10-CM | POA: Diagnosis not present

## 2021-12-13 DIAGNOSIS — G9520 Unspecified cord compression: Secondary | ICD-10-CM

## 2021-12-13 DIAGNOSIS — K592 Neurogenic bowel, not elsewhere classified: Secondary | ICD-10-CM | POA: Diagnosis not present

## 2021-12-13 DIAGNOSIS — M75112 Incomplete rotator cuff tear or rupture of left shoulder, not specified as traumatic: Secondary | ICD-10-CM
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
## 2021-12-20 DIAGNOSIS — E291 Testicular hypofunction: Secondary | ICD-10-CM | POA: Diagnosis not present

## 2021-12-20 DIAGNOSIS — R32 Unspecified urinary incontinence: Secondary | ICD-10-CM | POA: Diagnosis not present

## 2021-12-20 DIAGNOSIS — R7303 Prediabetes: Secondary | ICD-10-CM | POA: Diagnosis not present

## 2021-12-20 DIAGNOSIS — R159 Full incontinence of feces: Secondary | ICD-10-CM | POA: Diagnosis not present

## 2021-12-20 DIAGNOSIS — I10 Essential (primary) hypertension: Secondary | ICD-10-CM | POA: Diagnosis not present

## 2021-12-20 DIAGNOSIS — M7062 Trochanteric bursitis, left hip: Secondary | ICD-10-CM | POA: Diagnosis not present

## 2021-12-20 DIAGNOSIS — F5221 Male erectile disorder: Secondary | ICD-10-CM | POA: Diagnosis not present

## 2021-12-20 DIAGNOSIS — R718 Other abnormality of red blood cells: Secondary | ICD-10-CM | POA: Diagnosis not present

## 2021-12-20 DIAGNOSIS — D72829 Elevated white blood cell count, unspecified: Secondary | ICD-10-CM | POA: Diagnosis not present

## 2021-12-20 DIAGNOSIS — M542 Cervicalgia: Secondary | ICD-10-CM | POA: Diagnosis not present

## 2022-01-23 DIAGNOSIS — H35351 Cystoid macular degeneration, right eye: Secondary | ICD-10-CM | POA: Diagnosis not present

## 2022-01-23 DIAGNOSIS — H53141 Visual discomfort, right eye: Secondary | ICD-10-CM | POA: Diagnosis not present

## 2022-01-23 DIAGNOSIS — H02202 Unspecified lagophthalmos right lower eyelid: Secondary | ICD-10-CM | POA: Diagnosis not present

## 2022-01-23 DIAGNOSIS — H40052 Ocular hypertension, left eye: Secondary | ICD-10-CM | POA: Diagnosis not present

## 2022-01-23 DIAGNOSIS — H02205 Unspecified lagophthalmos left lower eyelid: Secondary | ICD-10-CM | POA: Diagnosis not present

## 2022-01-23 DIAGNOSIS — H30111 Disseminated chorioretinal inflammation of posterior pole, right eye: Secondary | ICD-10-CM | POA: Diagnosis not present

## 2022-01-24 ENCOUNTER — Other Ambulatory Visit (HOSPITAL_COMMUNITY): Payer: PPO

## 2022-01-31 ENCOUNTER — Ambulatory Visit (HOSPITAL_COMMUNITY): Payer: PPO | Admitting: Physician Assistant

## 2022-02-26 DIAGNOSIS — H40052 Ocular hypertension, left eye: Secondary | ICD-10-CM | POA: Diagnosis not present

## 2022-02-26 DIAGNOSIS — H35353 Cystoid macular degeneration, bilateral: Secondary | ICD-10-CM | POA: Diagnosis not present

## 2022-04-28 DIAGNOSIS — H04123 Dry eye syndrome of bilateral lacrimal glands: Secondary | ICD-10-CM | POA: Diagnosis not present

## 2022-04-28 DIAGNOSIS — H30111 Disseminated chorioretinal inflammation of posterior pole, right eye: Secondary | ICD-10-CM | POA: Diagnosis not present

## 2022-04-28 DIAGNOSIS — H02202 Unspecified lagophthalmos right lower eyelid: Secondary | ICD-10-CM | POA: Diagnosis not present

## 2022-04-28 DIAGNOSIS — H02205 Unspecified lagophthalmos left lower eyelid: Secondary | ICD-10-CM | POA: Diagnosis not present

## 2022-04-28 DIAGNOSIS — H30022 Focal chorioretinal inflammation of posterior pole, left eye: Secondary | ICD-10-CM | POA: Diagnosis not present

## 2022-04-28 DIAGNOSIS — H40052 Ocular hypertension, left eye: Secondary | ICD-10-CM | POA: Diagnosis not present

## 2022-06-18 DIAGNOSIS — E291 Testicular hypofunction: Secondary | ICD-10-CM | POA: Diagnosis not present

## 2022-06-18 DIAGNOSIS — R7303 Prediabetes: Secondary | ICD-10-CM | POA: Diagnosis not present

## 2022-06-18 DIAGNOSIS — I10 Essential (primary) hypertension: Secondary | ICD-10-CM | POA: Diagnosis not present

## 2022-06-30 DIAGNOSIS — R718 Other abnormality of red blood cells: Secondary | ICD-10-CM | POA: Diagnosis not present

## 2022-06-30 DIAGNOSIS — F5221 Male erectile disorder: Secondary | ICD-10-CM | POA: Diagnosis not present

## 2022-06-30 DIAGNOSIS — H35353 Cystoid macular degeneration, bilateral: Secondary | ICD-10-CM | POA: Diagnosis not present

## 2022-06-30 DIAGNOSIS — R32 Unspecified urinary incontinence: Secondary | ICD-10-CM | POA: Diagnosis not present

## 2022-06-30 DIAGNOSIS — E291 Testicular hypofunction: Secondary | ICD-10-CM | POA: Diagnosis not present

## 2022-06-30 DIAGNOSIS — H04123 Dry eye syndrome of bilateral lacrimal glands: Secondary | ICD-10-CM | POA: Diagnosis not present

## 2022-06-30 DIAGNOSIS — M7062 Trochanteric bursitis, left hip: Secondary | ICD-10-CM | POA: Diagnosis not present

## 2022-06-30 DIAGNOSIS — R159 Full incontinence of feces: Secondary | ICD-10-CM | POA: Diagnosis not present

## 2022-06-30 DIAGNOSIS — D72829 Elevated white blood cell count, unspecified: Secondary | ICD-10-CM | POA: Diagnosis not present

## 2022-06-30 DIAGNOSIS — Z0000 Encounter for general adult medical examination without abnormal findings: Secondary | ICD-10-CM | POA: Diagnosis not present

## 2022-06-30 DIAGNOSIS — I10 Essential (primary) hypertension: Secondary | ICD-10-CM | POA: Diagnosis not present

## 2022-06-30 DIAGNOSIS — R7303 Prediabetes: Secondary | ICD-10-CM | POA: Diagnosis not present

## 2022-06-30 DIAGNOSIS — E782 Mixed hyperlipidemia: Secondary | ICD-10-CM | POA: Diagnosis not present

## 2022-06-30 DIAGNOSIS — M542 Cervicalgia: Secondary | ICD-10-CM | POA: Diagnosis not present

## 2022-06-30 DIAGNOSIS — H35371 Puckering of macula, right eye: Secondary | ICD-10-CM | POA: Diagnosis not present

## 2022-06-30 DIAGNOSIS — H30022 Focal chorioretinal inflammation of posterior pole, left eye: Secondary | ICD-10-CM | POA: Diagnosis not present

## 2022-06-30 IMAGING — MR MR THORACIC SPINE W/O CM
4 of 6 series · 19 of 48 positions shown · non-contrast
Comparison: Concurrent MRI cervical and lumbar spine.

CLINICAL DATA: Myelopathy, acute or progressive -

EXAM:
MRI THORACIC SPINE WITHOUT CONTRAST
TECHNIQUE: Multiplanar, multisequence MR imaging of the thoracic spine was
performed. No intravenous contrast was administered.

[Series 2: T1 · sagittal · 3.0mm · 0.90mm/px · 3 of 12 slices shown (1 of 2)]
[im 1/12]
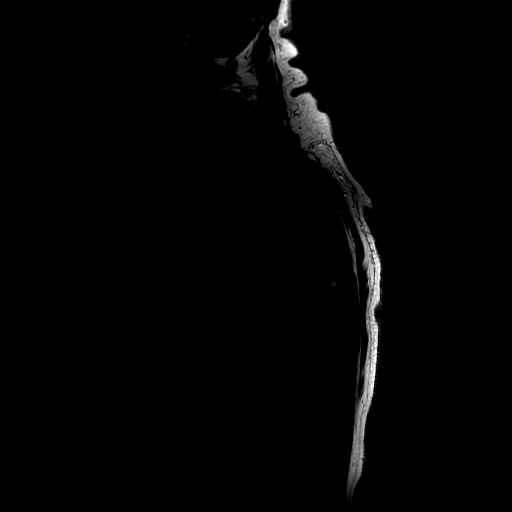
[im 8/12]
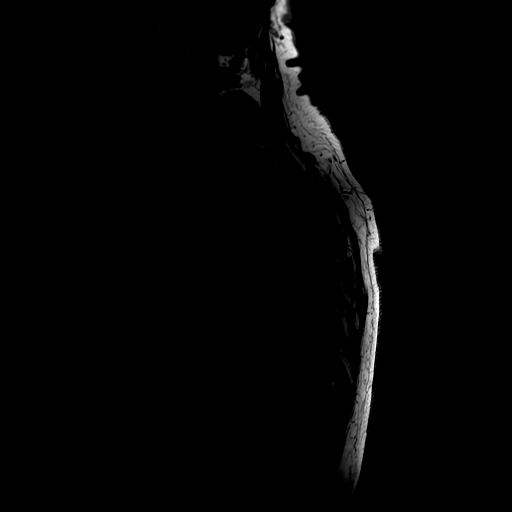
[im 12/12]
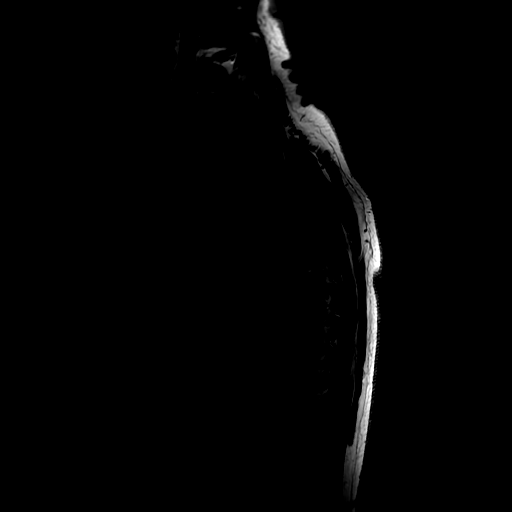

[Series 3: T2 · sagittal · 3.0mm · 0.66mm/px · 6 of 17 slices shown (1 of 2)]
[im 1/17]
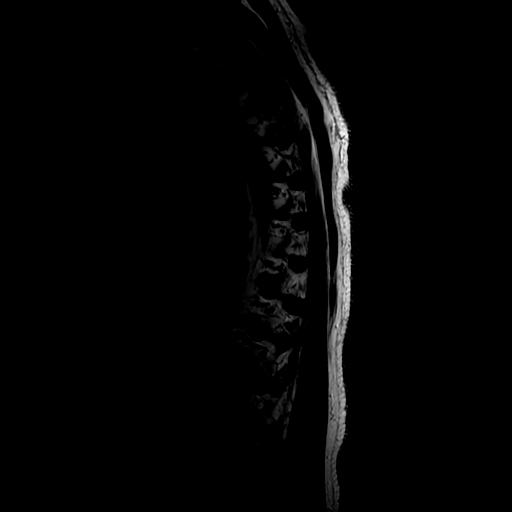
[im 4/17]
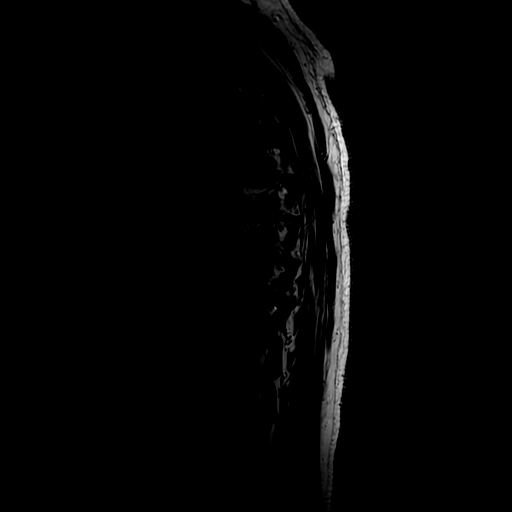
[im 7/17]
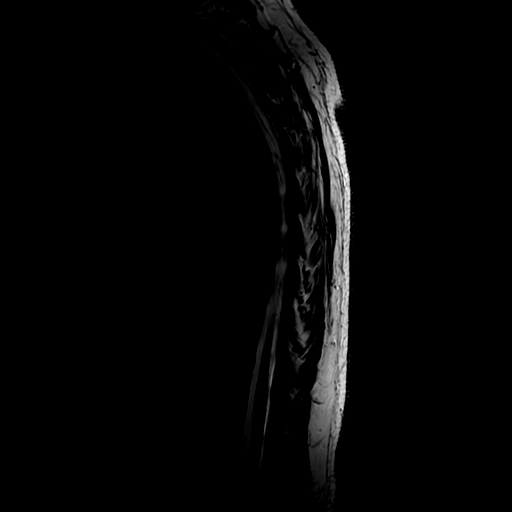
[im 10/17]
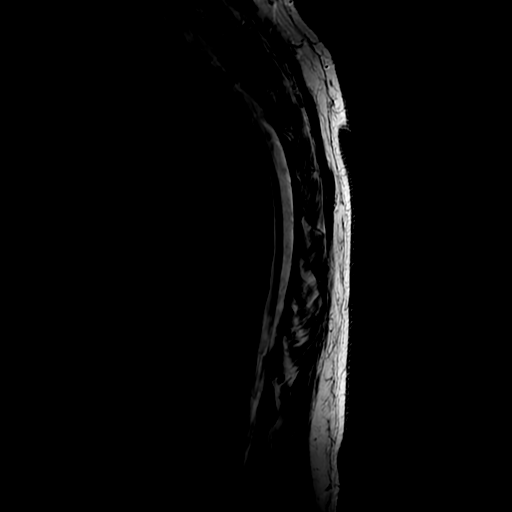
[im 13/17]
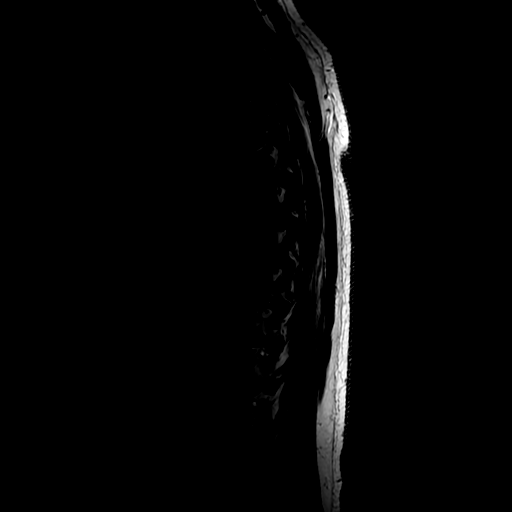
[im 17/17]
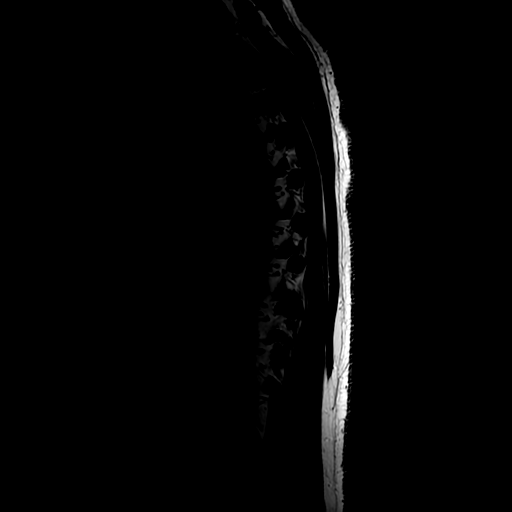

[Series 5: T1 · sagittal · 3.0mm · 0.66mm/px · 3 of 17 slices shown (2 of 2)]
[im 4/17]
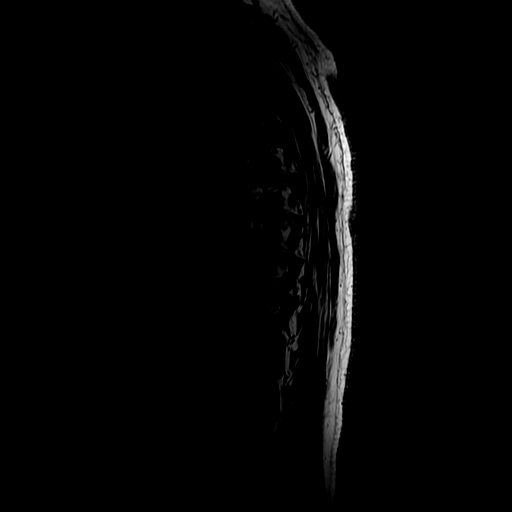
[im 10/17]
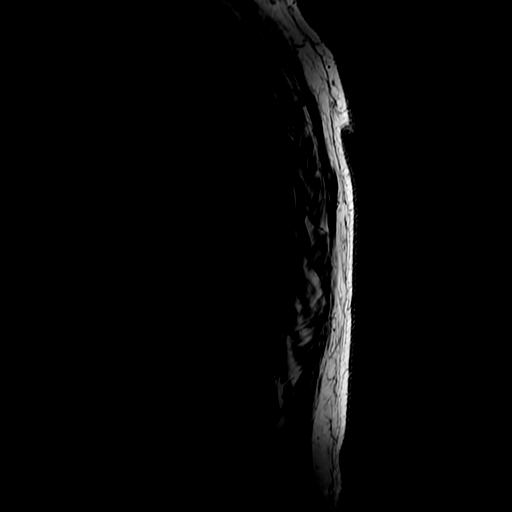
[im 17/17]
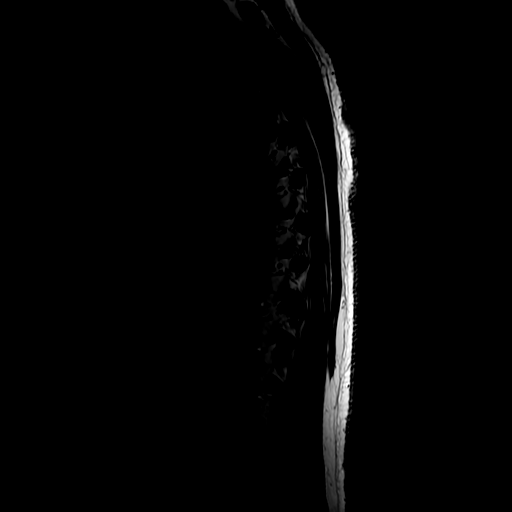

[Series 7: T2 · axial · 4.0mm · 0.39mm/px · z∈[-244,-53]mm · 7 of 38 slices shown (2 of 2)]
[im 1/38]
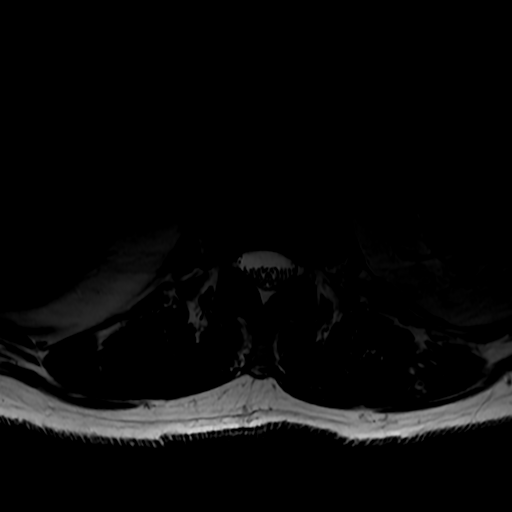
[im 7/38]
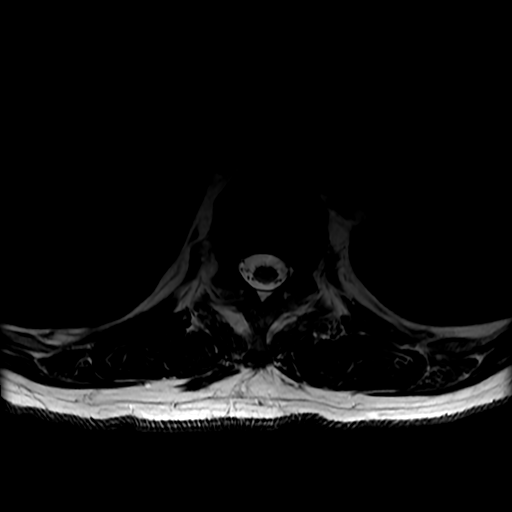
[im 13/38]
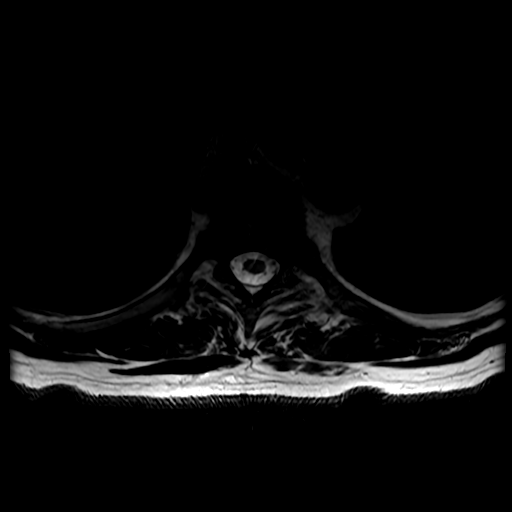
[im 16/38]
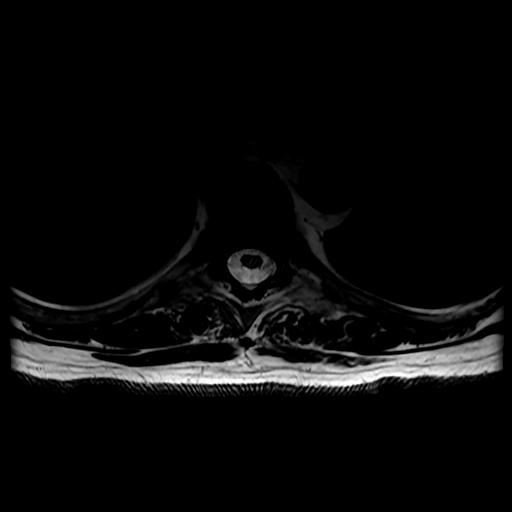
[im 19/38]
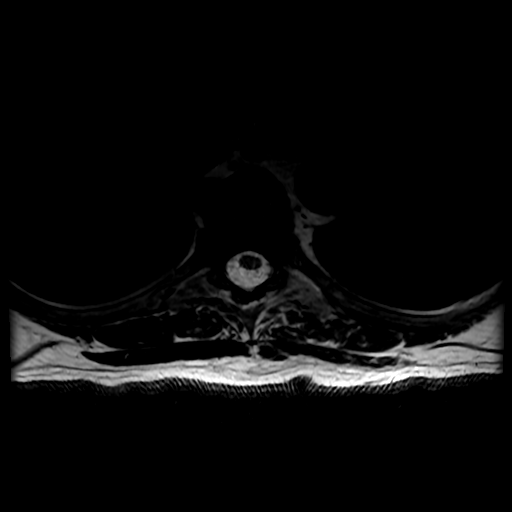
[im 22/38]
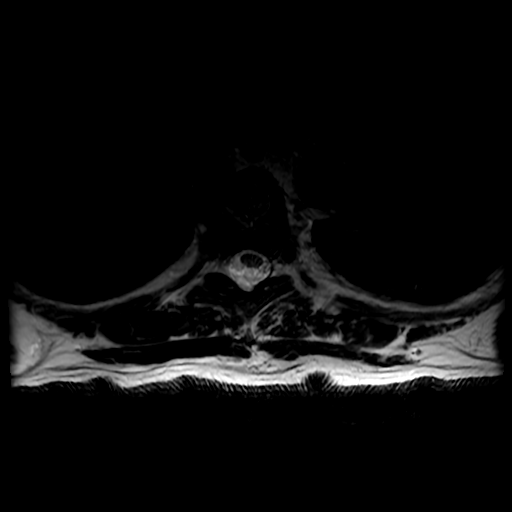
[im 31/38]
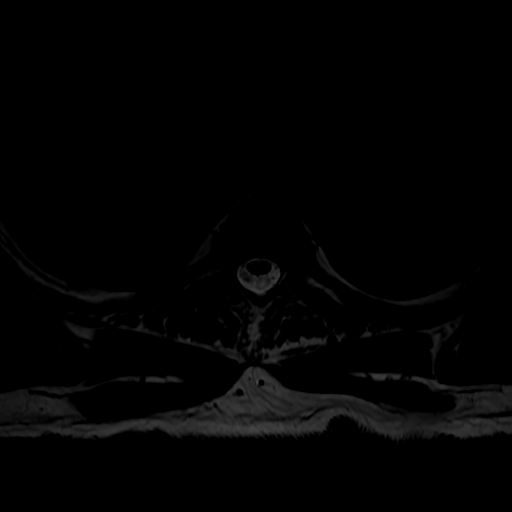

[19 of 48 positions shown; findings below may reference images not displayed]

FINDINGS: Please note that image quality is degraded by motion artifact.

Alignment:  Physiologic.  No listhesis.

Vertebrae: Scattered T1/T2 hyperintense foci reflect hemangiomata
versus focal fat. No aggressive osseous lesion. Chronic superior
endplate deformities at the T3, T5 level are likely degenerative
related and without significant height loss. Remaining vertebral
body heights are preserved.

Cord:  Normal signal and morphology.

Paraspinal and other soft tissues: Negative.

Disc levels:

Small left paracentral T7-8 protrusion. Patent spinal canal.
Multilevel facet degenerative spurring. Mild right T4-5 neural
foraminal narrowing. Remaining neural foramen are patent.
IMPRESSION: Mild degenerative changes.  No significant spinal canal narrowing.

Mild right T4-5 neural foraminal narrowing.
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
## 2022-07-18 DIAGNOSIS — R944 Abnormal results of kidney function studies: Secondary | ICD-10-CM | POA: Diagnosis not present

## 2022-07-18 DIAGNOSIS — R718 Other abnormality of red blood cells: Secondary | ICD-10-CM | POA: Diagnosis not present

## 2022-08-20 DIAGNOSIS — H40052 Ocular hypertension, left eye: Secondary | ICD-10-CM | POA: Diagnosis not present

## 2022-08-20 DIAGNOSIS — H35353 Cystoid macular degeneration, bilateral: Secondary | ICD-10-CM | POA: Diagnosis not present

## 2022-09-26 DIAGNOSIS — H04123 Dry eye syndrome of bilateral lacrimal glands: Secondary | ICD-10-CM | POA: Diagnosis not present

## 2022-09-26 DIAGNOSIS — H40052 Ocular hypertension, left eye: Secondary | ICD-10-CM | POA: Diagnosis not present

## 2022-09-26 DIAGNOSIS — H30111 Disseminated chorioretinal inflammation of posterior pole, right eye: Secondary | ICD-10-CM | POA: Diagnosis not present

## 2022-09-26 DIAGNOSIS — H30022 Focal chorioretinal inflammation of posterior pole, left eye: Secondary | ICD-10-CM | POA: Diagnosis not present

## 2022-09-29 ENCOUNTER — Encounter: Payer: Self-pay | Admitting: Physician Assistant

## 2022-09-29 NOTE — Progress Notes (Signed)
Patient called Benjamin Todd last week to cancel all of his future appointments.  He did not wish to continue care here since he feels that we "refused to help him when he needed it."  At last visit, patient was recommended for therapeutic phlebotomy due to secondary polycythemia in the setting of testosterone injections.  However, patient refused because he did not want to be billed for the procedure.  He was recommended to pursue blood donation in the community setting as an alternative.  He was also recommended to take aspirin 81 mg daily, but had refused this as well.  At his office visit last year, patient expressed belief that the COVID-vaccine was the root cause of many of his health issues.    Per patient's wishes, we have canceled his follow-up visits.  Recommend that he follow-up with primary care provider for ongoing management of this and other health conditions, but he can certainly be referred back to [place] in the future if he changes his mind and would like to receive care from our clinic.

## 2022-10-09 ENCOUNTER — Other Ambulatory Visit: Payer: PPO

## 2022-10-09 DIAGNOSIS — I10 Essential (primary) hypertension: Secondary | ICD-10-CM | POA: Diagnosis not present

## 2022-10-09 DIAGNOSIS — E291 Testicular hypofunction: Secondary | ICD-10-CM | POA: Diagnosis not present

## 2022-10-09 DIAGNOSIS — R7303 Prediabetes: Secondary | ICD-10-CM | POA: Diagnosis not present

## 2022-10-14 DIAGNOSIS — R944 Abnormal results of kidney function studies: Secondary | ICD-10-CM | POA: Diagnosis not present

## 2022-10-14 DIAGNOSIS — R32 Unspecified urinary incontinence: Secondary | ICD-10-CM | POA: Diagnosis not present

## 2022-10-14 DIAGNOSIS — E870 Hyperosmolality and hypernatremia: Secondary | ICD-10-CM | POA: Diagnosis not present

## 2022-10-14 DIAGNOSIS — E291 Testicular hypofunction: Secondary | ICD-10-CM | POA: Diagnosis not present

## 2022-10-14 DIAGNOSIS — I10 Essential (primary) hypertension: Secondary | ICD-10-CM | POA: Diagnosis not present

## 2022-10-14 DIAGNOSIS — D72829 Elevated white blood cell count, unspecified: Secondary | ICD-10-CM | POA: Diagnosis not present

## 2022-10-14 DIAGNOSIS — N529 Male erectile dysfunction, unspecified: Secondary | ICD-10-CM | POA: Diagnosis not present

## 2022-10-14 DIAGNOSIS — M542 Cervicalgia: Secondary | ICD-10-CM | POA: Diagnosis not present

## 2022-10-14 DIAGNOSIS — M4802 Spinal stenosis, cervical region: Secondary | ICD-10-CM | POA: Diagnosis not present

## 2022-10-14 DIAGNOSIS — R7303 Prediabetes: Secondary | ICD-10-CM | POA: Diagnosis not present

## 2022-10-14 DIAGNOSIS — M7062 Trochanteric bursitis, left hip: Secondary | ICD-10-CM | POA: Diagnosis not present

## 2022-10-14 DIAGNOSIS — E782 Mixed hyperlipidemia: Secondary | ICD-10-CM | POA: Diagnosis not present

## 2022-10-16 ENCOUNTER — Ambulatory Visit: Payer: PPO | Admitting: Physician Assistant

## 2023-02-02 DIAGNOSIS — H30022 Focal chorioretinal inflammation of posterior pole, left eye: Secondary | ICD-10-CM | POA: Diagnosis not present

## 2023-02-02 DIAGNOSIS — H40052 Ocular hypertension, left eye: Secondary | ICD-10-CM | POA: Diagnosis not present

## 2023-02-02 DIAGNOSIS — H04123 Dry eye syndrome of bilateral lacrimal glands: Secondary | ICD-10-CM | POA: Diagnosis not present

## 2023-02-02 DIAGNOSIS — H30111 Disseminated chorioretinal inflammation of posterior pole, right eye: Secondary | ICD-10-CM | POA: Diagnosis not present

## 2023-03-02 DIAGNOSIS — Z961 Presence of intraocular lens: Secondary | ICD-10-CM | POA: Diagnosis not present

## 2023-03-02 DIAGNOSIS — H35353 Cystoid macular degeneration, bilateral: Secondary | ICD-10-CM | POA: Diagnosis not present

## 2023-03-02 DIAGNOSIS — H40052 Ocular hypertension, left eye: Secondary | ICD-10-CM | POA: Diagnosis not present

## 2023-03-02 DIAGNOSIS — H40033 Anatomical narrow angle, bilateral: Secondary | ICD-10-CM | POA: Diagnosis not present

## 2023-04-08 DIAGNOSIS — R7303 Prediabetes: Secondary | ICD-10-CM | POA: Diagnosis not present

## 2023-04-08 DIAGNOSIS — E291 Testicular hypofunction: Secondary | ICD-10-CM | POA: Diagnosis not present

## 2023-04-08 DIAGNOSIS — I10 Essential (primary) hypertension: Secondary | ICD-10-CM | POA: Diagnosis not present

## 2023-04-14 DIAGNOSIS — R718 Other abnormality of red blood cells: Secondary | ICD-10-CM | POA: Diagnosis not present

## 2023-04-14 DIAGNOSIS — R32 Unspecified urinary incontinence: Secondary | ICD-10-CM | POA: Diagnosis not present

## 2023-04-14 DIAGNOSIS — D72829 Elevated white blood cell count, unspecified: Secondary | ICD-10-CM | POA: Diagnosis not present

## 2023-04-14 DIAGNOSIS — H40059 Ocular hypertension, unspecified eye: Secondary | ICD-10-CM | POA: Diagnosis not present

## 2023-04-14 DIAGNOSIS — M7062 Trochanteric bursitis, left hip: Secondary | ICD-10-CM | POA: Diagnosis not present

## 2023-04-14 DIAGNOSIS — R944 Abnormal results of kidney function studies: Secondary | ICD-10-CM | POA: Diagnosis not present

## 2023-04-14 DIAGNOSIS — I10 Essential (primary) hypertension: Secondary | ICD-10-CM | POA: Diagnosis not present

## 2023-04-14 DIAGNOSIS — M542 Cervicalgia: Secondary | ICD-10-CM | POA: Diagnosis not present

## 2023-04-14 DIAGNOSIS — N529 Male erectile dysfunction, unspecified: Secondary | ICD-10-CM | POA: Diagnosis not present

## 2023-04-14 DIAGNOSIS — E291 Testicular hypofunction: Secondary | ICD-10-CM | POA: Diagnosis not present

## 2023-04-14 DIAGNOSIS — R7303 Prediabetes: Secondary | ICD-10-CM | POA: Diagnosis not present

## 2023-07-20 DIAGNOSIS — H04123 Dry eye syndrome of bilateral lacrimal glands: Secondary | ICD-10-CM | POA: Diagnosis not present

## 2023-07-20 DIAGNOSIS — H30022 Focal chorioretinal inflammation of posterior pole, left eye: Secondary | ICD-10-CM | POA: Diagnosis not present

## 2023-07-20 DIAGNOSIS — H30111 Disseminated chorioretinal inflammation of posterior pole, right eye: Secondary | ICD-10-CM | POA: Diagnosis not present

## 2023-07-20 DIAGNOSIS — H35371 Puckering of macula, right eye: Secondary | ICD-10-CM | POA: Diagnosis not present

## 2023-10-09 DIAGNOSIS — E291 Testicular hypofunction: Secondary | ICD-10-CM | POA: Diagnosis not present

## 2023-10-09 DIAGNOSIS — I10 Essential (primary) hypertension: Secondary | ICD-10-CM | POA: Diagnosis not present

## 2023-10-09 DIAGNOSIS — R7303 Prediabetes: Secondary | ICD-10-CM | POA: Diagnosis not present

## 2023-10-15 DIAGNOSIS — E782 Mixed hyperlipidemia: Secondary | ICD-10-CM | POA: Diagnosis not present

## 2023-10-15 DIAGNOSIS — R7303 Prediabetes: Secondary | ICD-10-CM | POA: Diagnosis not present

## 2023-10-15 DIAGNOSIS — R944 Abnormal results of kidney function studies: Secondary | ICD-10-CM | POA: Diagnosis not present

## 2023-10-15 DIAGNOSIS — I10 Essential (primary) hypertension: Secondary | ICD-10-CM | POA: Diagnosis not present

## 2023-10-15 DIAGNOSIS — D72829 Elevated white blood cell count, unspecified: Secondary | ICD-10-CM | POA: Diagnosis not present

## 2023-10-15 DIAGNOSIS — M7062 Trochanteric bursitis, left hip: Secondary | ICD-10-CM | POA: Diagnosis not present

## 2023-10-15 DIAGNOSIS — R718 Other abnormality of red blood cells: Secondary | ICD-10-CM | POA: Diagnosis not present

## 2023-10-15 DIAGNOSIS — E870 Hyperosmolality and hypernatremia: Secondary | ICD-10-CM | POA: Diagnosis not present

## 2023-10-15 DIAGNOSIS — M542 Cervicalgia: Secondary | ICD-10-CM | POA: Diagnosis not present

## 2023-10-15 DIAGNOSIS — R32 Unspecified urinary incontinence: Secondary | ICD-10-CM | POA: Diagnosis not present

## 2023-10-15 DIAGNOSIS — N529 Male erectile dysfunction, unspecified: Secondary | ICD-10-CM | POA: Diagnosis not present

## 2024-03-03 DIAGNOSIS — H35353 Cystoid macular degeneration, bilateral: Secondary | ICD-10-CM | POA: Diagnosis not present

## 2024-03-03 DIAGNOSIS — H40033 Anatomical narrow angle, bilateral: Secondary | ICD-10-CM | POA: Diagnosis not present

## 2024-03-03 DIAGNOSIS — H40052 Ocular hypertension, left eye: Secondary | ICD-10-CM | POA: Diagnosis not present

## 2024-04-07 DIAGNOSIS — R7303 Prediabetes: Secondary | ICD-10-CM | POA: Diagnosis not present

## 2024-04-07 DIAGNOSIS — I10 Essential (primary) hypertension: Secondary | ICD-10-CM | POA: Diagnosis not present

## 2024-04-07 DIAGNOSIS — E291 Testicular hypofunction: Secondary | ICD-10-CM | POA: Diagnosis not present

## 2024-04-13 DIAGNOSIS — I10 Essential (primary) hypertension: Secondary | ICD-10-CM | POA: Diagnosis not present

## 2024-04-13 DIAGNOSIS — R718 Other abnormality of red blood cells: Secondary | ICD-10-CM | POA: Diagnosis not present

## 2024-04-13 DIAGNOSIS — R7303 Prediabetes: Secondary | ICD-10-CM | POA: Diagnosis not present

## 2024-04-13 DIAGNOSIS — M7062 Trochanteric bursitis, left hip: Secondary | ICD-10-CM | POA: Diagnosis not present

## 2024-04-13 DIAGNOSIS — E291 Testicular hypofunction: Secondary | ICD-10-CM | POA: Diagnosis not present

## 2024-04-13 DIAGNOSIS — N1831 Chronic kidney disease, stage 3a: Secondary | ICD-10-CM | POA: Diagnosis not present

## 2024-04-13 DIAGNOSIS — R32 Unspecified urinary incontinence: Secondary | ICD-10-CM | POA: Diagnosis not present

## 2024-04-13 DIAGNOSIS — I129 Hypertensive chronic kidney disease with stage 1 through stage 4 chronic kidney disease, or unspecified chronic kidney disease: Secondary | ICD-10-CM | POA: Diagnosis not present

## 2024-04-13 DIAGNOSIS — N529 Male erectile dysfunction, unspecified: Secondary | ICD-10-CM | POA: Diagnosis not present

## 2024-04-13 DIAGNOSIS — M542 Cervicalgia: Secondary | ICD-10-CM | POA: Diagnosis not present

## 2024-04-13 DIAGNOSIS — D72829 Elevated white blood cell count, unspecified: Secondary | ICD-10-CM | POA: Diagnosis not present

## 2024-04-13 DIAGNOSIS — E782 Mixed hyperlipidemia: Secondary | ICD-10-CM | POA: Diagnosis not present

## 2024-07-20 DIAGNOSIS — H30022 Focal chorioretinal inflammation of posterior pole, left eye: Secondary | ICD-10-CM | POA: Diagnosis not present

## 2024-07-20 DIAGNOSIS — H35373 Puckering of macula, bilateral: Secondary | ICD-10-CM | POA: Diagnosis not present

## 2024-07-20 DIAGNOSIS — H40053 Ocular hypertension, bilateral: Secondary | ICD-10-CM | POA: Diagnosis not present

## 2024-07-20 DIAGNOSIS — H30111 Disseminated chorioretinal inflammation of posterior pole, right eye: Secondary | ICD-10-CM | POA: Diagnosis not present

## 2024-07-20 DIAGNOSIS — H04123 Dry eye syndrome of bilateral lacrimal glands: Secondary | ICD-10-CM | POA: Diagnosis not present

## 2024-08-24 ENCOUNTER — Encounter: Payer: Self-pay | Admitting: *Deleted

## 2024-08-24 NOTE — Progress Notes (Signed)
 JAMIL ARMWOOD                                          MRN: [id number]   08/24/2024   The [facility] Quality Team Specialist reviewed this patient medical record for the purposes of chart review for care gap closure. The following were reviewed: chart review for care gap closure-controlling blood pressure.    [facility] Quality Team
